# Patient Record
Sex: Female | Born: 1945 | Race: White | Hispanic: No | Marital: Single | Smoking: Never smoker
Health system: Southern US, Community
[De-identification: ages and names within clinical notes are randomized; demographics above are authoritative.]

## PROBLEM LIST (undated history)

## (undated) DIAGNOSIS — K449 Diaphragmatic hernia without obstruction or gangrene: Secondary | ICD-10-CM

## (undated) DIAGNOSIS — R1084 Generalized abdominal pain: Secondary | ICD-10-CM

## (undated) DIAGNOSIS — N200 Calculus of kidney: Secondary | ICD-10-CM

## (undated) DIAGNOSIS — J449 Chronic obstructive pulmonary disease, unspecified: Secondary | ICD-10-CM

## (undated) DIAGNOSIS — E538 Deficiency of other specified B group vitamins: Secondary | ICD-10-CM

## (undated) DIAGNOSIS — IMO0001 Reserved for inherently not codable concepts without codable children: Secondary | ICD-10-CM

## (undated) DIAGNOSIS — R209 Unspecified disturbances of skin sensation: Principal | ICD-10-CM

## (undated) DIAGNOSIS — G8929 Other chronic pain: Secondary | ICD-10-CM

## (undated) DIAGNOSIS — G43909 Migraine, unspecified, not intractable, without status migrainosus: Secondary | ICD-10-CM

## (undated) DIAGNOSIS — E46 Unspecified protein-calorie malnutrition: Secondary | ICD-10-CM

## (undated) DIAGNOSIS — E785 Hyperlipidemia, unspecified: Secondary | ICD-10-CM

## (undated) DIAGNOSIS — E041 Nontoxic single thyroid nodule: Secondary | ICD-10-CM

## (undated) DIAGNOSIS — N29 Other disorders of kidney and ureter in diseases classified elsewhere: Secondary | ICD-10-CM

## (undated) DIAGNOSIS — E559 Vitamin D deficiency, unspecified: Secondary | ICD-10-CM

## (undated) DIAGNOSIS — R161 Splenomegaly, not elsewhere classified: Secondary | ICD-10-CM

## (undated) DIAGNOSIS — R634 Abnormal weight loss: Secondary | ICD-10-CM

## (undated) DIAGNOSIS — R3129 Other microscopic hematuria: Secondary | ICD-10-CM

## (undated) DIAGNOSIS — R64 Cachexia: Secondary | ICD-10-CM

## (undated) DIAGNOSIS — K219 Gastro-esophageal reflux disease without esophagitis: Secondary | ICD-10-CM

## (undated) DIAGNOSIS — F418 Other specified anxiety disorders: Secondary | ICD-10-CM

## (undated) DIAGNOSIS — G5600 Carpal tunnel syndrome, unspecified upper limb: Secondary | ICD-10-CM

## (undated) DIAGNOSIS — R1011 Right upper quadrant pain: Secondary | ICD-10-CM

## (undated) DIAGNOSIS — R768 Other specified abnormal immunological findings in serum: Secondary | ICD-10-CM

## (undated) DIAGNOSIS — R1310 Dysphagia, unspecified: Secondary | ICD-10-CM

## (undated) DIAGNOSIS — R590 Localized enlarged lymph nodes: Secondary | ICD-10-CM

## (undated) DIAGNOSIS — K5900 Constipation, unspecified: Secondary | ICD-10-CM

## (undated) DIAGNOSIS — G609 Hereditary and idiopathic neuropathy, unspecified: Secondary | ICD-10-CM

## (undated) HISTORY — DX: Hereditary and idiopathic neuropathy, unspecified: G60.9

## (undated) HISTORY — DX: Other specified abnormal immunological findings in serum: R76.8

## (undated) HISTORY — DX: Nontoxic single thyroid nodule: E04.1

## (undated) HISTORY — DX: Cachexia: R64

## (undated) HISTORY — DX: Gastro-esophageal reflux disease without esophagitis: K21.9

## (undated) HISTORY — DX: Carpal tunnel syndrome, unspecified upper limb: G56.00

## (undated) HISTORY — DX: Diaphragmatic hernia without obstruction or gangrene: K44.9

## (undated) HISTORY — DX: Hyperlipidemia, unspecified: E78.5

## (undated) HISTORY — DX: Chronic obstructive pulmonary disease, unspecified: J44.9

## (undated) HISTORY — DX: Dysphagia, unspecified: R13.10

## (undated) HISTORY — DX: Deficiency of other specified B group vitamins: E53.8

## (undated) HISTORY — DX: Vitamin D deficiency, unspecified: E55.9

## (undated) HISTORY — DX: Abnormal weight loss: R63.4

## (undated) HISTORY — DX: Other specified anxiety disorders: F41.8

## (undated) HISTORY — DX: Other disorders of kidney and ureter in diseases classified elsewhere: N29

## (undated) HISTORY — DX: Unspecified disturbances of skin sensation: R20.9

## (undated) HISTORY — DX: Other disorders of calcium metabolism: E83.59

## (undated) HISTORY — DX: Other microscopic hematuria: R31.29

## (undated) HISTORY — DX: Migraine, unspecified, not intractable, without status migrainosus: G43.909

## (undated) HISTORY — DX: Calculus of kidney: N20.0

## (undated) HISTORY — DX: Localized enlarged lymph nodes: R59.0

## (undated) HISTORY — DX: Other chronic pain: G89.29

## (undated) HISTORY — DX: Constipation, unspecified: K59.00

## (undated) HISTORY — DX: Reserved for inherently not codable concepts without codable children: IMO0001

## (undated) HISTORY — DX: Right upper quadrant pain: R10.11

## (undated) HISTORY — PX: OTHER SURGICAL HISTORY: SHX169

## (undated) HISTORY — DX: Unspecified protein-calorie malnutrition: E46

## (undated) HISTORY — DX: Generalized abdominal pain: R10.84

## (undated) HISTORY — DX: Splenomegaly, not elsewhere classified: R16.1

---

## 1976-08-29 HISTORY — PX: CHOLECYSTECTOMY: SHX55

## 1982-08-29 HISTORY — PX: NASAL SEPTUM SURGERY: SHX37

## 1986-08-29 HISTORY — PX: TUBAL LIGATION: SHX77

## 1996-08-29 HISTORY — PX: DILATION AND CURETTAGE OF UTERUS: SHX78

## 1996-08-29 HISTORY — PX: ABDOMINAL HYSTERECTOMY: SHX81

## 2003-11-14 ENCOUNTER — Emergency Department (HOSPITAL_COMMUNITY): Admission: EM | Admit: 2003-11-14 | Discharge: 2003-11-14 | Payer: Self-pay | Admitting: Emergency Medicine

## 2009-05-06 ENCOUNTER — Inpatient Hospital Stay (HOSPITAL_COMMUNITY): Admission: EM | Admit: 2009-05-06 | Discharge: 2009-05-08 | Payer: Self-pay | Admitting: Emergency Medicine

## 2010-12-03 LAB — CBC
HCT: 32.3 % — ABNORMAL LOW (ref 36.0–46.0)
Hemoglobin: 10.9 g/dL — ABNORMAL LOW (ref 12.0–15.0)
MCHC: 33.9 g/dL (ref 30.0–36.0)
MCV: 92.9 fL (ref 78.0–100.0)
MCV: 93.5 fL (ref 78.0–100.0)
Platelets: 293 10*3/uL (ref 150–400)
RBC: 3.45 MIL/uL — ABNORMAL LOW (ref 3.87–5.11)
RDW: 13.9 % (ref 11.5–15.5)
RDW: 14 % (ref 11.5–15.5)
WBC: 14.1 10*3/uL — ABNORMAL HIGH (ref 4.0–10.5)

## 2010-12-03 LAB — COMPREHENSIVE METABOLIC PANEL
ALT: 15 U/L (ref 0–35)
BUN: 7 mg/dL (ref 6–23)
CO2: 26 mEq/L (ref 19–32)
Calcium: 8.5 mg/dL (ref 8.4–10.5)
Creatinine, Ser: 0.41 mg/dL (ref 0.4–1.2)
GFR calc non Af Amer: >60 mL/min (ref >60)
Glucose, Bld: 86 mg/dL (ref 70–99)
Sodium: 140 mEq/L (ref 135–145)
Total Protein: 5.9 g/dL — ABNORMAL LOW (ref 6.0–8.3)

## 2010-12-03 LAB — CULTURE, BLOOD (ROUTINE X 2)
Culture: NO GROWTH
Culture: NO GROWTH

## 2010-12-03 LAB — BASIC METABOLIC PANEL
BUN: 7 mg/dL (ref 6–23)
Creatinine, Ser: 0.47 mg/dL (ref 0.4–1.2)
GFR calc non Af Amer: >60 mL/min (ref >60)
Glucose, Bld: 114 mg/dL — ABNORMAL HIGH (ref 70–99)

## 2010-12-03 LAB — POCT CARDIAC MARKERS: Troponin i, poc: <0.05 ng/mL (ref 0.00–0.09)

## 2010-12-03 LAB — DIFFERENTIAL
Basophils Absolute: 0.2 10*3/uL — ABNORMAL HIGH (ref 0.0–0.1)
Eosinophils Absolute: 0 10*3/uL (ref 0.0–0.7)
Eosinophils Absolute: 0 10*3/uL (ref 0.0–0.7)
Eosinophils Relative: 0 % (ref 0–5)
Lymphs Abs: 1.5 10*3/uL (ref 0.7–4.0)
Lymphs Abs: 2 10*3/uL (ref 0.7–4.0)
Monocytes Relative: 6 % (ref 3–12)
Neutro Abs: 6 10*3/uL (ref 1.7–7.7)
Neutrophils Relative %: 70 % (ref 43–77)
Neutrophils Relative %: 84 % — ABNORMAL HIGH (ref 43–77)

## 2010-12-03 LAB — PROTIME-INR: Prothrombin Time: 12.2 seconds (ref 11.6–15.2)

## 2010-12-03 LAB — D-DIMER, QUANTITATIVE: D-Dimer, Quant: 0.34 ug/mL-FEU (ref 0.00–0.48)

## 2010-12-03 LAB — LACTIC ACID, PLASMA: Lactic Acid, Venous: 1.9 mmol/L (ref 0.5–2.2)

## 2010-12-03 LAB — MAGNESIUM: Magnesium: 2 mg/dL (ref 1.5–2.5)

## 2013-08-12 ENCOUNTER — Encounter: Payer: Self-pay | Admitting: Neurology

## 2013-08-13 ENCOUNTER — Ambulatory Visit (INDEPENDENT_AMBULATORY_CARE_PROVIDER_SITE_OTHER): Payer: Medicare Other | Admitting: Neurology

## 2013-08-13 ENCOUNTER — Encounter: Payer: Self-pay | Admitting: Neurology

## 2013-08-13 ENCOUNTER — Encounter (INDEPENDENT_AMBULATORY_CARE_PROVIDER_SITE_OTHER): Payer: Self-pay

## 2013-08-13 VITALS — BP 131/79 | HR 104 | Ht 66.0 in | Wt 98.0 lb

## 2013-08-13 DIAGNOSIS — IMO0001 Reserved for inherently not codable concepts without codable children: Secondary | ICD-10-CM

## 2013-08-13 DIAGNOSIS — R209 Unspecified disturbances of skin sensation: Secondary | ICD-10-CM

## 2013-08-13 HISTORY — DX: Unspecified disturbances of skin sensation: R20.9

## 2013-08-13 HISTORY — DX: Reserved for inherently not codable concepts without codable children: IMO0001

## 2013-08-13 NOTE — Patient Instructions (Signed)

## 2013-08-13 NOTE — Progress Notes (Signed)
Reason for visit: Numbness  Catherine Gregory is a 67 y.o. female  History of present illness:  Catherine Gregory is a 67 year old right-handed white female with a history of fibromyalgia that dates back 10/17/1996. The patient indicates that she has some discomfort in the legs, arms, and body. The patient has pain in the knees, shoulders, thighs and hips. The pain does vary some day-to-day, with some migratory features. The patient has also had some patchy numbness that goes back to 1998 as well, again on the arms and legs with some migratory features. Within the last 6 weeks, the patient has had some migration of the numbness on the lower face up to the eye level bilaterally. The patient indicates that she has had some facial numbness in the lower face for number of years. The patient indicates that her fibromyalgia-type symptoms began after a hysterectomy in 1998. The patient does have some gait instability, but she denies problems controlling the bowels or the bladder. The patient has soreness of the feet, and she describes sharp lancinating pains that may occur throughout the body. The patient may have some achy sensations as well. The patient may have occasional headaches that she attributes to a sinus type headache. The patient has some generalized fatigue, no focal weakness. The patient denies any vision changes. In the past, the patient indicates that she has been told that she had an abnormal MRI the brain, and it was felt that she could have multiple sclerosis. Treatment was recommended, but the patient never initiated treatment for MS. The patient refused to have a lumbar puncture. The patient has recently moved from [place] to the [place], [place] area. The patient currently has no primary care physician. The patient comes to this office for an evaluation. Recent blood work has been done that includes a vitamin B12 level, thyroid profile, chemistry profile that were unremarkable, the  results were reviewed. The patient indicates that she has a history of a peripheral neuropathy, but it is not clear what studies have been done to confirm this. The patient also reports that she coughs with eating and swallowing, and prior swallow studies have been unremarkable.  Past Medical History  Diagnosis Date  . Disturbance of skin sensation 08/13/2013  . Myalgia and myositis, unspecified 08/13/2013  . Depression with anxiety   . GERD (gastroesophageal reflux disease)   . Hiatal hernia   . Malnutrition   . Migraine   . Renal calculus     Past Surgical History  Procedure Laterality Date  . Cholecystectomy    . Abdominal hysterectomy    . Tubal ligation Bilateral   . Dilation and curettage of uterus    . Nasal septum surgery      Family History  Problem Relation Age of Onset  . Heart Problems Father   . Multiple sclerosis Sister   . Heart Problems Brother   . Hypertension Brother   . Heart Problems Brother   . Hypertension Brother   . Hypertension Brother   . Hypertension Brother   . Hypertension Brother     Social history:  reports that she has never smoked. She has never used smokeless tobacco. She reports that she does not drink alcohol or use illicit drugs.  Medications:  Current Outpatient Prescriptions on File Prior to Visit  Medication Sig Dispense Refill  . estradiol (CLIMARA - DOSED IN MG/24 HR) 0.025 mg/24hr patch Place 0.025 mg onto the skin once a week. BI WEEKLY  No current facility-administered medications on file prior to visit.      Allergies  Allergen Reactions  . Prednisone   . Albuterol   . Benadryl [Diphenhydramine Hcl]   . Doxycycline   . Ducodyl [Bisacodyl]   . Erythromycin   . Levaquin [Levofloxacin In D5w]     ROS:  Out of a complete 14 system review of symptoms, the patient complains only of the following symptoms, and all other reviewed systems are negative.  Fatigue Difficulty swallowing Itching sensations Eye  pain Cough, wheezing Easy bruising Feeling hot, cold Joint pain, achy muscles Allergies, runny nose, skin sensitivity Headache, numbness, weakness, difficulty swallowing  Blood pressure 131/79, pulse 104, height 5\' 6"  (1.676 m), weight 98 lb (44.453 kg).  Physical Exam  General: The patient is alert and cooperative at the time of the examination.  Head: Pupils are equal, round, and reactive to light. Discs are flat bilaterally.  Neck: The neck is supple, no carotid bruits are noted.  Respiratory: The respiratory examination is clear.  Cardiovascular: The cardiovascular examination reveals a regular rate and rhythm, no obvious murmurs or rubs are noted.  Skin: Extremities are without significant edema.  Neurologic Exam  Mental status: The patient is alert and oriented x 3 at the time of the examination.  Cranial nerves: Facial symmetry is present. There is good sensation of the face to pinprick and soft touch bilaterally. The strength of the facial muscles and the muscles to head turning and shoulder shrug are normal bilaterally. Speech is well enunciated, no aphasia or dysarthria is noted. Extraocular movements are full. Visual fields are full.  Motor: The motor testing reveals 5 over 5 strength of all 4 extremities. Good symmetric motor tone is noted throughout.  Sensory: Sensory testing is intact to pinprick, soft touch, vibration sensation, and position sense on all 4 extremities, with exception that pinprick sensation is decreased on the left leg. No evidence of extinction is noted.  Coordination: Cerebellar testing reveals good finger-nose-finger and heel-to-shin bilaterally.  Gait and station: Gait is normal. Tandem gait is slightly unsteady. Romberg is negative. No drift is seen.  Reflexes: Deep tendon reflexes are symmetric, but are depressed bilaterally. Toes are downgoing bilaterally.   Assessment/Plan:  1. Fibromyalgia  2. Migratory sensory alteration  The  patient has recently moved to this area. The patient has long-standing history of migratory pain, sensory changes, and some history of an abnormal MRI of the brain. The patient will be set up for a MRI brain study, and some blood work today. If the MRI of the brain actually is abnormal, we will consider MRI of the cervical spine to evaluate for demyelinating disease. Migratory sensory phenomena maybe associated with the fibromyalgia syndrome. The patient will followup in 4 months. The patient does not wish to have any medications for her fibromyalgia.  Marlan Palau MD 08/13/2013 7:19 PM  [facility] [address] [place], [place] [address]  Phone [phone] Fax [phone]

## 2013-08-16 ENCOUNTER — Telehealth: Payer: Self-pay | Admitting: *Deleted

## 2013-08-16 LAB — COPPER, SERUM: Copper: 116 ug/dL (ref 72–166)

## 2013-08-16 LAB — LYME, TOTAL AB TEST/REFLEX: Lyme IgG/IgM Ab: <0.91 {ISR} (ref 0.00–0.90)

## 2013-08-16 LAB — ANGIOTENSIN CONVERTING ENZYME: Angio Convert Enzyme: 51 U/L (ref 14–82)

## 2013-08-16 NOTE — Telephone Encounter (Signed)
Called patient to relay his lab results where unremarkable.

## 2013-08-16 NOTE — Telephone Encounter (Signed)
Message copied by Salome Spotted on Fri Aug 16, 2013  1:27 PM ------      Message from: Stephanie Acre      Created: Fri Aug 16, 2013  7:52 AM       Please call the patient. The blood work results are unremarkable. Thank you.            ----- Message -----         From: [facility] Lab Results In Interface         Sent: 08/16/2013   5:47 AM           To: York Spaniel, MD                   ------

## 2013-08-20 ENCOUNTER — Telehealth: Payer: Self-pay | Admitting: Neurology

## 2013-08-20 NOTE — Telephone Encounter (Signed)
Shared unremarkable labs with patient per Dr Clarisa Kindred findings,patient verbalized understanding

## 2013-08-20 NOTE — Telephone Encounter (Signed)
Patient returning call about lab results. Please call the patient back.

## 2013-08-23 ENCOUNTER — Ambulatory Visit
Admission: RE | Admit: 2013-08-23 | Discharge: 2013-08-23 | Disposition: A | Discharge status: Home / Self Care | Payer: Medicaid Other | Source: Ambulatory Visit | Attending: Neurology | Admitting: Neurology

## 2013-08-23 DIAGNOSIS — IMO0001 Reserved for inherently not codable concepts without codable children: Secondary | ICD-10-CM

## 2013-08-23 DIAGNOSIS — R209 Unspecified disturbances of skin sensation: Secondary | ICD-10-CM

## 2013-09-02 ENCOUNTER — Telehealth: Payer: Self-pay | Admitting: *Deleted

## 2013-09-06 NOTE — Telephone Encounter (Signed)
Gave to [place], she stated that no new order needed.  Will check on the new insurance auth.

## 2013-09-12 ENCOUNTER — Ambulatory Visit
Admission: RE | Admit: 2013-09-12 | Discharge: 2013-09-12 | Disposition: A | Discharge status: Home / Self Care | Payer: Medicare HMO | Source: Ambulatory Visit | Attending: Neurology | Admitting: Neurology

## 2013-09-12 DIAGNOSIS — R209 Unspecified disturbances of skin sensation: Secondary | ICD-10-CM

## 2013-09-16 ENCOUNTER — Telehealth: Payer: Self-pay | Admitting: Neurology

## 2013-09-16 DIAGNOSIS — R9089 Other abnormal findings on diagnostic imaging of central nervous system: Secondary | ICD-10-CM

## 2013-09-16 DIAGNOSIS — R209 Unspecified disturbances of skin sensation: Secondary | ICD-10-CM

## 2013-09-16 NOTE — Telephone Encounter (Signed)
I called patient. MRI the brain showed nonspecific white matter changes. The patient in the past has been told she may have multiple sclerosis, but she also has a history of migraine which can produce similar white matter changes. I have recommended MRI of the cervical spine. If this is unremarkable, we will pursue lumbar puncture.  IMPRESSION: Abnormal MRI brain showing nonspecific supratentorial white matter hyperintensities with differential discussed above.

## 2013-09-24 ENCOUNTER — Inpatient Hospital Stay: Admission: RE | Admit: 2013-09-24 | Payer: Medicare HMO | Source: Ambulatory Visit

## 2013-09-25 ENCOUNTER — Encounter: Payer: Self-pay | Admitting: Physician Assistant

## 2013-09-25 ENCOUNTER — Ambulatory Visit (INDEPENDENT_AMBULATORY_CARE_PROVIDER_SITE_OTHER): Payer: Medicare HMO | Admitting: Physician Assistant

## 2013-09-25 VITALS — BP 136/80 | HR 106 | Temp 97.8°F | Ht 65.25 in | Wt 94.5 lb

## 2013-09-25 DIAGNOSIS — R634 Abnormal weight loss: Secondary | ICD-10-CM | POA: Insufficient documentation

## 2013-09-25 DIAGNOSIS — R1310 Dysphagia, unspecified: Secondary | ICD-10-CM | POA: Insufficient documentation

## 2013-09-25 DIAGNOSIS — Z780 Asymptomatic menopausal state: Secondary | ICD-10-CM

## 2013-09-25 DIAGNOSIS — Z0000 Encounter for general adult medical examination without abnormal findings: Secondary | ICD-10-CM

## 2013-09-25 DIAGNOSIS — K449 Diaphragmatic hernia without obstruction or gangrene: Secondary | ICD-10-CM

## 2013-09-25 DIAGNOSIS — R209 Unspecified disturbances of skin sensation: Secondary | ICD-10-CM

## 2013-09-25 DIAGNOSIS — G609 Hereditary and idiopathic neuropathy, unspecified: Secondary | ICD-10-CM | POA: Insufficient documentation

## 2013-09-25 DIAGNOSIS — E041 Nontoxic single thyroid nodule: Secondary | ICD-10-CM | POA: Insufficient documentation

## 2013-09-25 DIAGNOSIS — R109 Unspecified abdominal pain: Secondary | ICD-10-CM | POA: Insufficient documentation

## 2013-09-25 DIAGNOSIS — J438 Other emphysema: Secondary | ICD-10-CM | POA: Insufficient documentation

## 2013-09-25 DIAGNOSIS — N29 Other disorders of kidney and ureter in diseases classified elsewhere: Secondary | ICD-10-CM

## 2013-09-25 DIAGNOSIS — E559 Vitamin D deficiency, unspecified: Secondary | ICD-10-CM | POA: Insufficient documentation

## 2013-09-25 DIAGNOSIS — H579 Unspecified disorder of eye and adnexa: Secondary | ICD-10-CM

## 2013-09-25 DIAGNOSIS — E785 Hyperlipidemia, unspecified: Secondary | ICD-10-CM | POA: Insufficient documentation

## 2013-09-25 DIAGNOSIS — K219 Gastro-esophageal reflux disease without esophagitis: Secondary | ICD-10-CM | POA: Insufficient documentation

## 2013-09-25 DIAGNOSIS — K5900 Constipation, unspecified: Secondary | ICD-10-CM | POA: Insufficient documentation

## 2013-09-25 NOTE — Progress Notes (Signed)
[name] is a 68 y.o. female DOB: [id number] MRN: [id number]     HPI: patient is a 68 year old female who presents to the clinic to establish care. Patient has recently relocated to local area. Has been seen by neurology, Dr. Anne HahnWillis, for numbness and tingling of U/LE bilateral, recently starting in neck and lower face. Patient states concern for MS, reporting a sister with MS and similar symptoms. Patient reports long history of Fibromyalgia, hiatal hernia, past colon polyps, rectocele, thyroiditis with thyroid nodule and increased ocular pressure. Reports medications to include a Vitamin D and Estradiol. States she has tried multiple medications in past for different diagnosis however, states always feels worse when taking the medications so discontinues use. Denies chest pain/palpitations, chest tightening, SOB, cough, fever/chills, change in activity, change in bowel/bladder habits, visual change or disturbance.   Influenza vaccine: 11/14 Colonoscopy: 2012/normal Mammo: spring 2014 Pneumovax: yes, date uncertain  Zoster: 2011  Request referrals to GYN, Neuro, GI and Opthalmology    ROS: As stated in HPI. All other systems negative   Past Medical History  Diagnosis Date  . Disturbance of skin sensation 08/13/2013  . Myalgia and myositis, unspecified 08/13/2013  . Depression with anxiety   . GERD (gastroesophageal reflux disease)   . Hiatal hernia   . Malnutrition   . Migraine   . Renal calculus   . Dysphagia, idiopathic   . Abnormal weight loss   . GERD (gastroesophageal reflux disease)   . COPD (chronic obstructive pulmonary disease)   . Elevated antinuclear antibody (ANA) level   . Vitamin D deficiency   . Idiopathic peripheral neuropathy   . Cachexia   . Abdominal pain, chronic, generalized   . Constipation   . Vitamin B12 deficiency   . Right thyroid nodule   . Lymphadenopathy, inguinal   . Spleen enlarged   . Abdominal pain, RUQ   .  Nephrocalcinosis   . Microscopic hematuria   . Hyperlipidemia    Family History  Problem Relation Age of Onset  . Heart Problems Father   . Hyperlipidemia Father   . Heart disease Father   . Hypertension Father   . Multiple sclerosis Sister   . Heart Problems Brother   . Hypertension Brother   . Heart Problems Brother   . Hypertension Brother   . Hypertension Brother   . Hypertension Brother   . Hypertension Brother   . Cancer Maternal Aunt     Colon   History   Social History  . Marital Status: Married    Spouse Name: N/A    Number of Children: 2  . Years of Education: COLLEGE-2   Occupational History  . RETIRED    Social History Main Topics  . Smoking status: Never Smoker   . Smokeless tobacco: Never Used  . Alcohol Use: No     Comment: FORMER WINE OCCASIONALLY/ QUIT 1997  . Drug Use: No  . Sexual Activity: None   Other Topics Concern  . None   Social History Narrative  . None   Past Surgical History  Procedure Laterality Date  . Cholecystectomy  1978  . Abdominal hysterectomy  1998  . Tubal ligation Bilateral 1988  . Dilation and curettage of uterus  1998  . Nasal septum surgery  1984    PE: CONSTITUTIONAL: Well developed, well nourished, pleasant, appears stated age, in NAD HEENT: normocephalic, atraumatic, bilateral ext/int canals normal. Bilateral TM's without injections, bulging, erythema. Nose normal, uvula midline, oropharynx  clear and moist. EYES: PERRLA, bilateral EOM and conjunctiva normal NECK: FROM, supple, without thyromegaly or mass CARDIO: RRR, normal S1 and S2, distal pulses intact. PULM/CHEST CTA bilateral, no wheezes, rales or rhonchi. Non tender. ABD: appearance normal, soft, nontender. Normal bowel sounds x 4 quadrants GU: deferred to GYN MUSC: FROM U/LE bilateral LYMPH: no cervical, supraclavicular adenopathy NEURO: alert and oriented x 3, no cranial nerve deficit, motor strength and coordination NL. Patellar DTR's 2+, Negative  romberg. Gait normal. SKIN: warm, dry, no rash or lesions noted. PSYCH: Mood and affect normal, speech normal.   Lab Results  Component Value Date   WBC 8.5 05/06/2009   HGB 10.9 DELTA CHECK NOTED REPEATED TO VERIFY* 05/06/2009   HCT 32.3* 05/06/2009   PLT 219 DELTA CHECK NOTED REPEATED TO VERIFY 05/06/2009   GLUCOSE 86 05/06/2009   ALT 15 05/06/2009   AST 19 05/06/2009   NA 140 05/06/2009   K 3.9 05/06/2009   CL 109 05/06/2009   CREATININE 0.41 05/06/2009   BUN 7 05/06/2009   CO2 26 05/06/2009   INR 0.9 05/05/2009    ASSESSMENT and PLAN   CPX/v70.0 - Patient has been counseled on age-appropriate routine health concerns for screening and prevention. These are reviewed and up-to-date. Immunizations are up-to-date or declined. Labs ordered and will be reviewed.  Referrals to GYN, Neuro, GI and Opthalmology placed.   Patient instructed to follow up in 2-3 months or sooner if needed.

## 2013-09-25 NOTE — Assessment & Plan Note (Signed)
Neurology A&P from 12/14  The patient has recently moved to this area. The patient has long-standing history of migratory pain, sensory changes, and some history of an abnormal MRI of the brain. The patient will be set up for a MRI brain study, and some blood work today. If the MRI of the brain actually is abnormal, we will consider MRI of the cervical spine to evaluate for demyelinating disease. Migratory sensory phenomena maybe associated with the fibromyalgia syndrome. The patient will followup in 4 months. The patient does not wish to have any medications for her fibromyalgia.

## 2013-09-25 NOTE — Patient Instructions (Signed)
It was great meeting you today Catherine Gregory!  Labs have been ordered for you, when you report to lab please be fasting.   I have placed your referrals as discussed.   Please return to office in next month or two for complete physical exam.   Health Recommendations for Postmenopausal Women Respected and ongoing research has looked at the most common causes of death, disability, and poor quality of life in postmenopausal women. The causes include heart disease, diseases of blood vessels, diabetes, depression, cancer, and bone loss (osteoporosis). Many things can be done to help lower the chances of developing these and other common problems: CARDIOVASCULAR DISEASE Heart Disease: A heart attack is a medical emergency. Know the signs and symptoms of a heart attack. Below are things women can do to reduce their risk for heart disease.   Do not smoke. If you smoke, quit.  Aim for a healthy weight. Being overweight causes many preventable deaths. Eat a healthy and balanced diet and drink an adequate amount of liquids.  Get moving. Make a commitment to be more physically active. Aim for 30 minutes of activity on most, if not all days of the week.  Eat for heart health. Choose a diet that is low in saturated fat and cholesterol and eliminate trans fat. Include whole grains, vegetables, and fruits. Read and understand the labels on food containers before buying.  Know your numbers. Ask your caregiver to check your blood pressure, cholesterol (total, HDL, LDL, triglycerides) and blood glucose. Work with your caregiver on improving your entire clinical picture.  High blood pressure. Limit or stop your table salt intake (try salt substitute and food seasonings). Avoid salty foods and drinks. Read labels on food containers before buying. Eating well and exercising can help control high blood pressure. STROKE  Stroke is a medical emergency. Stroke may be the result of a blood clot in a blood vessel in the  brain or by a brain hemorrhage (bleeding). Know the signs and symptoms of a stroke. To lower the risk of developing a stroke:  Avoid fatty foods.  Quit smoking.  Control your diabetes, blood pressure, and irregular heart rate. THROMBOPHLEBITIS (BLOOD CLOT) OF THE LEG  Becoming overweight and leading a stationary lifestyle may also contribute to developing blood clots. Controlling your diet and exercising will help lower the risk of developing blood clots. CANCER SCREENING  Breast Cancer: Take steps to reduce your risk of breast cancer.  You should practice "breast self-awareness." This means understanding the normal appearance and feel of your breasts and should include breast self-examination. Any changes detected, no matter how small, should be reported to your caregiver.  After age 40, you should have a clinical breast exam (CBE) every year.  Starting at age 80, you should consider having a mammogram (breast X-ray) every year.  If you have a family history of breast cancer, talk to your caregiver about genetic screening.  If you are at high risk for breast cancer, talk to your caregiver about having an MRI and a mammogram every year.  Intestinal or Stomach Cancer: Tests to consider are a rectal exam, fecal occult blood, sigmoidoscopy, and colonoscopy. Women who are high risk may need to be screened at an earlier age and more often.  Cervical Cancer:  Beginning at age 24, you should have a Pap test every 3 years as long as the past 3 Pap tests have been normal.  If you have had past treatment for cervical cancer or a condition that  could lead to cancer, you need Pap tests and screening for cancer for at least 20 years after your treatment.  If you had a hysterectomy for a problem that was not cancer or a condition that could lead to cancer, then you no longer need Pap tests.  If you are between ages 6465 and 1570, and you have had normal Pap tests going back 10 years, you no longer  need Pap tests.  If Pap tests have been discontinued, risk factors (such as a new sexual partner) need to be reassessed to determine if screening should be resumed.  Some medical problems can increase the chance of getting cervical cancer. In these cases, your caregiver may recommend more frequent screening and Pap tests.  Uterine Cancer: If you have vaginal bleeding after reaching menopause, you should notify your caregiver.  Ovarian cancer: Other than yearly pelvic exams, there are no reliable tests available to screen for ovarian cancer at this time except for yearly pelvic exams.  Lung Cancer: Yearly chest X-rays can detect lung cancer and should be done on high risk women, such as cigarette smokers and women with chronic lung disease (emphysema).  Skin Cancer: A complete body skin exam should be done at your yearly examination. Avoid overexposure to the sun and ultraviolet light lamps. Use a strong sun block cream when in the sun. All of these things are important in lowering the risk of skin cancer. MENOPAUSE Menopause Symptoms: Hormone therapy products are effective for treating symptoms associated with menopause:  Moderate to severe hot flashes.  Night sweats.  Mood swings.  Headaches.  Tiredness.  Loss of sex drive.  Insomnia.  Other symptoms. Hormone replacement carries certain risks, especially in older women. Women who use or are thinking about using estrogen or estrogen with progestin treatments should discuss that with their caregiver. Your caregiver will help you understand the benefits and risks. The ideal dose of hormone replacement therapy is not known. The Food and Drug Administration (FDA) has concluded that hormone therapy should be used only at the lowest doses and for the shortest amount of time to reach treatment goals.  OSTEOPOROSIS Protecting Against Bone Loss and Preventing Fracture: If you use hormone therapy for prevention of bone loss (osteoporosis),  the risks for bone loss must outweigh the risk of the therapy. Ask your caregiver about other medications known to be safe and effective for preventing bone loss and fractures. To guard against bone loss or fractures, the following is recommended:  If you are less than age 69, take 1000 mg of calcium and at least 600 mg of Vitamin D per day.  If you are greater than age 69 but less than age 69, take 1200 mg of calcium and at least 600 mg of Vitamin D per day.  If you are greater than age 69, take 1200 mg of calcium and at least 800 mg of Vitamin D per day. Smoking and excessive alcohol intake increases the risk of osteoporosis. Eat foods rich in calcium and vitamin D and do weight bearing exercises several times a week as your caregiver suggests. DIABETES Diabetes Melitus: If you have Type I or Type 2 diabetes, you should keep your blood sugar under control with diet, exercise and recommended medication. Avoid too many sweets, starchy and fatty foods. Being overweight can make control more difficult. COGNITION AND MEMORY Cognition and Memory: Menopausal hormone therapy is not recommended for the prevention of cognitive disorders such as Alzheimer's disease or memory loss.  DEPRESSION  Depression may occur at any age, but is common in elderly women. The reasons may be because of physical, medical, social (loneliness), or financial problems and needs. If you are experiencing depression because of medical problems and control of symptoms, talk to your caregiver about this. Physical activity and exercise may help with mood and sleep. Community and volunteer involvement may help your sense of value and worth. If you have depression and you feel that the problem is getting worse or becoming severe, talk to your caregiver about treatment options that are best for you. ACCIDENTS  Accidents are common and can be serious in the elderly woman. Prepare your house to prevent accidents. Eliminate throw rugs, place  hand bars in the bath, shower and toilet areas. Avoid wearing high heeled shoes or walking on wet, snowy, and icy areas. Limit or stop driving if you have vision or hearing problems, or you feel you are unsteady with you movements and reflexes. HEPATITIS C Hepatitis C is a type of viral infection affecting the liver. It is spread mainly through contact with blood from an infected person. It can be treated, but if left untreated, it can lead to severe liver damage over years. Many people who are infected do not know that the virus is in their blood. If you are a "baby-boomer", it is recommended that you have one screening test for Hepatitis C. IMMUNIZATIONS  Several immunizations are important to consider having during your senior years, including:   Tetanus, diptheria, and pertussis booster shot.  Influenza every year before the flu season begins.  Pneumonia vaccine.  Shingles vaccine.  Others as indicated based on your specific needs. Talk to your caregiver about these. Document Released: 10/07/2005 Document Revised: 08/01/2012 Document Reviewed: 06/02/2008 [facility] Patient Information 2014 [place], [place]. Hypertriglyceridemia  Diet for High blood levels of Triglycerides Most fats in food are triglycerides. Triglycerides in your blood are stored as fat in your body. High levels of triglycerides in your blood may put you at a greater risk for heart disease and stroke.  Normal triglyceride levels are less than 150 mg/dL. Borderline high levels are 150-199 mg/dl. High levels are 200 - 499 mg/dL, and very high triglyceride levels are greater than 500 mg/dL. The decision to treat high triglycerides is generally based on the level. For people with borderline or high triglyceride levels, treatment includes weight loss and exercise. Drugs are recommended for people with very high triglyceride levels. Many people who need treatment for high triglyceride levels have metabolic syndrome. This syndrome is  a collection of disorders that often include: insulin resistance, high blood pressure, blood clotting problems, high cholesterol and triglycerides. TESTING PROCEDURE FOR TRIGLYCERIDES  You should not eat 4 hours before getting your triglycerides measured. The normal range of triglycerides is between 10 and 250 milligrams per deciliter (mg/dl). Some people may have extreme levels (1000 or above), but your triglyceride level may be too high if it is above 150 mg/dl, depending on what other risk factors you have for heart disease.  People with high blood triglycerides may also have high blood cholesterol levels. If you have high blood cholesterol as well as high blood triglycerides, your risk for heart disease is probably greater than if you only had high triglycerides. High blood cholesterol is one of the main risk factors for heart disease. CHANGING YOUR DIET  Your weight can affect your blood triglyceride level. If you are more than 20% above your ideal body weight, you may be able to lower your  blood triglycerides by losing weight. Eating less and exercising regularly is the best way to combat this. Fat provides more calories than any other food. The best way to lose weight is to eat less fat. Only 30% of your total calories should come from fat. Less than 7% of your diet should come from saturated fat. A diet low in fat and saturated fat is the same as a diet to decrease blood cholesterol. By eating a diet lower in fat, you may lose weight, lower your blood cholesterol, and lower your blood triglyceride level.  Eating a diet low in fat, especially saturated fat, may also help you lower your blood triglyceride level. Ask your dietitian to help you figure how much fat you can eat based on the number of calories your caregiver has prescribed for you.  Exercise, in addition to helping with weight loss may also help lower triglyceride levels.   Alcohol can increase blood triglycerides. You may need to stop  drinking alcoholic beverages.  Too much carbohydrate in your diet may also increase your blood triglycerides. Some complex carbohydrates are necessary in your diet. These may include bread, rice, potatoes, other starchy vegetables and cereals.  Reduce "simple" carbohydrates. These may include pure sugars, candy, honey, and jelly without losing other nutrients. If you have the kind of high blood triglycerides that is affected by the amount of carbohydrates in your diet, you will need to eat less sugar and less high-sugar foods. Your caregiver can help you with this.  Adding 2-4 grams of fish oil (EPA+ DHA) may also help lower triglycerides. Speak with your caregiver before adding any supplements to your regimen. Following the Diet  Maintain your ideal weight. Your caregivers can help you with a diet. Generally, eating less food and getting more exercise will help you lose weight. Joining a weight control group may also help. Ask your caregivers for a good weight control group in your area.  Eat low-fat foods instead of high-fat foods. This can help you lose weight too.  These foods are lower in fat. Eat MORE of these:   Dried beans, peas, and lentils.  Egg whites.  Low-fat cottage cheese.  Fish.  Lean cuts of meat, such as round, sirloin, rump, and flank (cut extra fat off meat you fix).  Whole grain breads, cereals and pasta.  Skim and nonfat dry milk.  Low-fat yogurt.  Poultry without the skin.  Cheese made with skim or part-skim milk, such as mozzarella, parmesan, farmers', ricotta, or pot cheese. These are higher fat foods. Eat LESS of these:   Whole milk and foods made from whole milk, such as American, blue, cheddar, monterey jack, and swiss cheese  High-fat meats, such as luncheon meats, sausages, knockwurst, bratwurst, hot dogs, ribs, corned beef, ground pork, and regular ground beef.  Fried foods. Limit saturated fats in your diet. Substituting unsaturated fat for  saturated fat may decrease your blood triglyceride level. You will need to read package labels to know which products contain saturated fats.  These foods are high in saturated fat. Eat LESS of these:   Fried pork skins.  Whole milk.  Skin and fat from poultry.  Palm oil.  Butter.  Shortening.  Cream cheese.  Tomasa Blase.  Margarines and baked goods made from listed oils.  Vegetable shortenings.  Chitterlings.  Fat from meats.  Coconut oil.  Palm kernel oil.  Lard.  Cream.  Sour cream.  Fatback.  Coffee whiteners and non-dairy creamers made with these oils.  Cheese made from whole milk. Use unsaturated fats (both polyunsaturated and monounsaturated) moderately. Remember, even though unsaturated fats are better than saturated fats; you still want a diet low in total fat.  These foods are high in unsaturated fat:   Canola oil.  Sunflower oil.  Mayonnaise.  Almonds.  Peanuts.  Pine nuts.  Margarines made with these oils.  Safflower oil.  Olive oil.  Avocados.  Cashews.  Peanut butter.  Sunflower seeds.  Soybean oil.  Peanut oil.  Olives.  Pecans.  Walnuts.  Pumpkin seeds. Avoid sugar and other high-sugar foods. This will decrease carbohydrates without decreasing other nutrients. Sugar in your food goes rapidly to your blood. When there is excess sugar in your blood, your liver may use it to make more triglycerides. Sugar also contains calories without other important nutrients.  Eat LESS of these:   Sugar, brown sugar, powdered sugar, jam, jelly, preserves, honey, syrup, molasses, pies, candy, cakes, cookies, frosting, pastries, colas, soft drinks, punches, fruit drinks, and regular gelatin.  Avoid alcohol. Alcohol, even more than sugar, may increase blood triglycerides. In addition, alcohol is high in calories and low in nutrients. Ask for sparkling water, or a diet soft drink instead of an alcoholic beverage. Suggestions for planning  and preparing meals   Bake, broil, grill or roast meats instead of frying.  Remove fat from meats and skin from poultry before cooking.  Add spices, herbs, lemon juice or vinegar to vegetables instead of salt, rich sauces or gravies.  Use a non-stick skillet without fat or use no-stick sprays.  Cool and refrigerate stews and broth. Then remove the hardened fat floating on the surface before serving.  Refrigerate meat drippings and skim off fat to make low-fat gravies.  Serve more fish.  Use less butter, margarine and other high-fat spreads on bread or vegetables.  Use skim or reconstituted non-fat dry milk for cooking.  Cook with low-fat cheeses.  Substitute low-fat yogurt or cottage cheese for all or part of the sour cream in recipes for sauces, dips or congealed salads.  Use half yogurt/half mayonnaise in salad recipes.  Substitute evaporated skim milk for cream. Evaporated skim milk or reconstituted non-fat dry milk can be whipped and substituted for whipped cream in certain recipes.  Choose fresh fruits for dessert instead of high-fat foods such as pies or cakes. Fruits are naturally low in fat. When Dining Out   Order low-fat appetizers such as fruit or vegetable juice, pasta with vegetables or tomato sauce.  Select clear, rather than cream soups.  Ask that dressings and gravies be served on the side. Then use less of them.  Order foods that are baked, broiled, poached, steamed, stir-fried, or roasted.  Ask for margarine instead of butter, and use only a small amount.  Drink sparkling water, unsweetened tea or coffee, or diet soft drinks instead of alcohol or other sweet beverages. QUESTIONS AND ANSWERS ABOUT OTHER FATS IN THE BLOOD: SATURATED FAT, TRANS FAT, AND CHOLESTEROL What is trans fat? Trans fat is a type of fat that is formed when vegetable oil is hardened through a process called hydrogenation. This process helps makes foods more solid, gives them shape, and  prolongs their shelf life. Trans fats are also called hydrogenated or partially hydrogenated oils.  What do saturated fat, trans fat, and cholesterol in foods have to do with heart disease? Saturated fat, trans fat, and cholesterol in the diet all raise the level of LDL "bad" cholesterol in the blood. The higher the  LDL cholesterol, the greater the risk for coronary heart disease (CHD). Saturated fat and trans fat raise LDL similarly.  What foods contain saturated fat, trans fat, and cholesterol? High amounts of saturated fat are found in animal products, such as fatty cuts of meat, chicken skin, and full-fat dairy products like butter, whole milk, cream, and cheese, and in tropical vegetable oils such as palm, palm kernel, and coconut oil. Trans fat is found in some of the same foods as saturated fat, such as vegetable shortening, some margarines (especially hard or stick margarine), crackers, cookies, baked goods, fried foods, salad dressings, and other processed foods made with partially hydrogenated vegetable oils. Small amounts of trans fat also occur naturally in some animal products, such as milk products, beef, and lamb. Foods high in cholesterol include liver, other organ meats, egg yolks, shrimp, and full-fat dairy products. How can I use the new food label to make heart-healthy food choices? Check the Nutrition Facts panel of the food label. Choose foods lower in saturated fat, trans fat, and cholesterol. For saturated fat and cholesterol, you can also use the Percent Daily Value (%DV): 5% DV or less is low, and 20% DV or more is high. (There is no %DV for trans fat.) Use the Nutrition Facts panel to choose foods low in saturated fat and cholesterol, and if the trans fat is not listed, read the ingredients and limit products that list shortening or hydrogenated or partially hydrogenated vegetable oil, which tend to be high in trans fat. POINTS TO REMEMBER:   Discuss your risk for heart disease  with your caregivers, and take steps to reduce risk factors.  Change your diet. Choose foods that are low in saturated fat, trans fat, and cholesterol.  Add exercise to your daily routine if it is not already being done. Participate in physical activity of moderate intensity, like brisk walking, for at least 30 minutes on most, and preferably all days of the week. No time? Break the 30 minutes into three, 10-minute segments during the day.  Stop smoking. If you do smoke, contact your caregiver to discuss ways in which they can help you quit.  Do not use street drugs.  Maintain a normal weight.  Maintain a healthy blood pressure.  Keep up with your blood work for checking the fats in your blood as directed by your caregiver. Document Released: 06/02/2004 Document Revised: 02/14/2012 Document Reviewed: 12/29/2008 [facility] Patient Information 2014 [place], [place].

## 2013-09-25 NOTE — Progress Notes (Signed)
Pre visit review using our clinic review tool, if applicable. No additional management support is needed unless otherwise documented below in the visit note. 

## 2013-10-10 ENCOUNTER — Other Ambulatory Visit (INDEPENDENT_AMBULATORY_CARE_PROVIDER_SITE_OTHER): Payer: Medicare HMO

## 2013-10-10 DIAGNOSIS — E785 Hyperlipidemia, unspecified: Secondary | ICD-10-CM

## 2013-10-10 DIAGNOSIS — E041 Nontoxic single thyroid nodule: Secondary | ICD-10-CM

## 2013-10-10 LAB — CBC WITH DIFFERENTIAL/PLATELET
BASOS ABS: 0 10*3/uL (ref 0.0–0.1)
Basophils Relative: 0.8 % (ref 0.0–3.0)
EOS PCT: 1.7 % (ref 0.0–5.0)
Eosinophils Absolute: 0.1 10*3/uL (ref 0.0–0.7)
HEMATOCRIT: 44.7 % (ref 36.0–46.0)
HEMOGLOBIN: 14.5 g/dL (ref 12.0–15.0)
LYMPHS ABS: 2.1 10*3/uL (ref 0.7–4.0)
LYMPHS PCT: 43.1 % (ref 12.0–46.0)
MCHC: 32.4 g/dL (ref 30.0–36.0)
MCV: 98.9 fl (ref 78.0–100.0)
Monocytes Absolute: 0.3 10*3/uL (ref 0.1–1.0)
Monocytes Relative: 6.2 % (ref 3.0–12.0)
NEUTROS ABS: 2.4 10*3/uL (ref 1.4–7.7)
Neutrophils Relative %: 48.2 % (ref 43.0–77.0)
Platelets: 220 10*3/uL (ref 150.0–400.0)
RBC: 4.52 Mil/uL (ref 3.87–5.11)
RDW: 13.7 % (ref 11.5–14.6)
WBC: 5 10*3/uL (ref 4.5–10.5)

## 2013-10-10 LAB — LIPID PANEL
Cholesterol: 218 mg/dL — ABNORMAL HIGH (ref 0–200)
HDL: 109.7 mg/dL (ref >39.00)
Total CHOL/HDL Ratio: 2
Triglycerides: 44 mg/dL (ref 0.0–149.0)
VLDL: 8.8 mg/dL (ref 0.0–40.0)

## 2013-10-10 LAB — HEPATIC FUNCTION PANEL
ALT: 20 U/L (ref 0–35)
AST: 23 U/L (ref 0–37)
Albumin: 4.3 g/dL (ref 3.5–5.2)
Alkaline Phosphatase: 61 U/L (ref 39–117)
BILIRUBIN DIRECT: 0.1 mg/dL (ref 0.0–0.3)
BILIRUBIN TOTAL: 1.1 mg/dL (ref 0.3–1.2)
TOTAL PROTEIN: 7.5 g/dL (ref 6.0–8.3)

## 2013-10-10 LAB — LDL CHOLESTEROL, DIRECT: LDL DIRECT: 90.8 mg/dL

## 2013-10-10 LAB — URINALYSIS, ROUTINE W REFLEX MICROSCOPIC
BILIRUBIN URINE: NEGATIVE
KETONES UR: 15 — AB
LEUKOCYTES UA: NEGATIVE
NITRITE: NEGATIVE
PH: 5.5 (ref 5.0–8.0)
SPECIFIC GRAVITY, URINE: 1.025 (ref 1.000–1.030)
Total Protein, Urine: NEGATIVE
Urine Glucose: NEGATIVE
Urobilinogen, UA: 0.2 (ref 0.0–1.0)

## 2013-10-10 LAB — BASIC METABOLIC PANEL
BUN: 13 mg/dL (ref 6–23)
CALCIUM: 9.6 mg/dL (ref 8.4–10.5)
CO2: 28 mEq/L (ref 19–32)
CREATININE: 0.5 mg/dL (ref 0.4–1.2)
Chloride: 102 mEq/L (ref 96–112)
GFR: 127.42 mL/min (ref >60.00)
GLUCOSE: 84 mg/dL (ref 70–99)
Potassium: 4.1 mEq/L (ref 3.5–5.1)
Sodium: 138 mEq/L (ref 135–145)

## 2013-10-10 LAB — TSH: TSH: 4.1 u[IU]/mL (ref 0.35–5.50)

## 2013-10-11 ENCOUNTER — Ambulatory Visit
Admission: RE | Admit: 2013-10-11 | Discharge: 2013-10-11 | Disposition: A | Discharge status: Home / Self Care | Payer: Medicare HMO | Source: Ambulatory Visit | Attending: Neurology | Admitting: Neurology

## 2013-10-11 ENCOUNTER — Telehealth: Payer: Self-pay | Admitting: Neurology

## 2013-10-11 DIAGNOSIS — R209 Unspecified disturbances of skin sensation: Secondary | ICD-10-CM

## 2013-10-11 DIAGNOSIS — R9089 Other abnormal findings on diagnostic imaging of central nervous system: Secondary | ICD-10-CM

## 2013-10-11 MED ORDER — GADOBENATE DIMEGLUMINE 529 MG/ML IV SOLN
8.0000 mL | Freq: Once | INTRAVENOUS | Status: AC | PRN
  Administered 2013-10-11: 8 mL via INTRAVENOUS

## 2013-10-11 NOTE — Telephone Encounter (Signed)
Received information from [place]. 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 The patient has a history of a positive ANA associated with joint pain. MRI of the cervical spine as been done in December 2007, and did not show evidence of spinal cord lesions. Degenerative changes were seen at the C4-5 and C5-6 levels with disc bulges as well. MRI of the lumbosacral spine was unremarkable. The patient does have nonspecific white matter changes on a recent MRI the brain, we have recommended a repeat MRI of the cervical spine.

## 2013-10-14 ENCOUNTER — Telehealth: Payer: Self-pay | Admitting: Neurology

## 2013-10-14 NOTE — Telephone Encounter (Signed)
I called patient. The MRI the cervical spine did not show any cord lesions. Mild spondylosis. I discussed the possibility of obtaining a lumbar puncture. The patient is not clear that she wants this, but she wants an answer concerning her sensory alterations now. If this is the case, I recommend having a lumbar puncture. The patient will "think about it", and call office if she decides she wants the study done.

## 2013-10-15 ENCOUNTER — Telehealth: Payer: Self-pay | Admitting: *Deleted

## 2013-10-16 NOTE — Telephone Encounter (Signed)
Spoke with patient and she would like Dr Clarisa KindredWillis's nurse to call her to explain in more details the results of the MR Cervical Spine and had some questions about the spinal tap, has a chronic cough and an hiatal hernia, has trouble lying flat w/o coughing and has a history of aspirating

## 2013-10-17 NOTE — Telephone Encounter (Signed)
Spoke to patient and she explained that she has other issues that she didn't make the doctor aware of.  Because she has trouble lying flat, coughing episodes,  and has had aspirated pneumonia she is hesitant to have a LP.  I told her I would let the doctor know and speak to her about her concerns.

## 2013-10-17 NOTE — Telephone Encounter (Signed)
I called patient. The patient indicates that she cannot tolerate lying down flat because of severe reflux issues. The patient is unable to have a lumbar puncture. We will follow the MRI over time, recheck the MRI the brain in 9 months. The patient will followup in office in April 2015.

## 2013-10-24 ENCOUNTER — Encounter: Payer: Medicare HMO | Admitting: Physician Assistant

## 2013-10-29 ENCOUNTER — Ambulatory Visit (INDEPENDENT_AMBULATORY_CARE_PROVIDER_SITE_OTHER)
Admission: RE | Admit: 2013-10-29 | Discharge: 2013-10-29 | Disposition: A | Discharge status: Home / Self Care | Payer: Medicare HMO | Source: Ambulatory Visit | Attending: Physician Assistant | Admitting: Physician Assistant

## 2013-10-29 ENCOUNTER — Encounter: Payer: Self-pay | Admitting: Physician Assistant

## 2013-10-29 ENCOUNTER — Ambulatory Visit (INDEPENDENT_AMBULATORY_CARE_PROVIDER_SITE_OTHER): Payer: Medicare HMO | Admitting: Physician Assistant

## 2013-10-29 VITALS — BP 90/62 | HR 99 | Temp 97.7°F | Wt 98.0 lb

## 2013-10-29 DIAGNOSIS — M25539 Pain in unspecified wrist: Secondary | ICD-10-CM

## 2013-10-29 DIAGNOSIS — I7300 Raynaud's syndrome without gangrene: Secondary | ICD-10-CM

## 2013-10-29 DIAGNOSIS — M25531 Pain in right wrist: Secondary | ICD-10-CM

## 2013-10-29 NOTE — Progress Notes (Signed)
Subjective:    [name], Catherine Gregory    DOB: 1946-05-01, 68 y.o.   MRN: [id number]  HPI Comments: Patient is a 68 year old Catherine Gregory who presents to the office with complaint of dominant right wrist pain. Reports pain began nearly one week ago. States feels as if she sprained her wrist somehow however, is unaware of injury or trauma to the extremity. Pain radiates into her right thumb. States had been driving the day before in the bad weather and pain started after that. Symptoms increase with extension of the wrist. Has chronic numbness tingling to the fingers. Has tried wrapping with an ace bandage with mild relief of symptoms. Also complains of bilateral fingertips becoming "extremely" white with blue spotting on the surface when she is digging in the freezer looking for items or when washing her hands in cold water. Denies injury or trauma, fever, increased weakness. History significant for myalgia and myositis, disturbance of skin sensation and possible MS.    Review of Systems  Constitutional: Negative for fever and chills.  Musculoskeletal: Negative for joint swelling.       Dominant right wrist pain  Skin: Negative for rash.  Neurological: Positive for numbness (chronic, generalized all over). Negative for weakness.   Past Medical History  Diagnosis Date  . Disturbance of skin sensation 08/13/2013  . Myalgia and myositis, unspecified 08/13/2013  . Depression with anxiety   . GERD (gastroesophageal reflux disease)   . Hiatal hernia   . Malnutrition   . Migraine   . Renal calculus   . Dysphagia, idiopathic   . Abnormal weight loss   . GERD (gastroesophageal reflux disease)   . COPD (chronic obstructive pulmonary disease)   . Elevated antinuclear antibody (ANA) level   . Vitamin D deficiency   . Idiopathic peripheral neuropathy   . Cachexia   . Abdominal pain, chronic, generalized   . Constipation   . Vitamin B12 deficiency   . Right thyroid nodule   .  Lymphadenopathy, inguinal   . Spleen enlarged   . Abdominal pain, RUQ   . Nephrocalcinosis   . Microscopic hematuria   . Hyperlipidemia        Objective:   Physical Exam  Vitals reviewed. Constitutional: She is oriented to person, place, and time.  Thin, appears stated age, seated comfortably on exam room chair. NAD  HENT:  Head: Normocephalic and atraumatic.  Left Ear: External ear normal.  Eyes: Conjunctivae are normal.  Neck: Normal range of motion.  Cardiovascular: Normal rate and regular rhythm.  Exam reveals no gallop and no friction rub.   No murmur heard. Pulses:      Radial pulses are 2+ on the right side, and 2+ on the left side.  Pulmonary/Chest: Effort normal and breath sounds normal. She has no wheezes. She has no rales.  Musculoskeletal:  Right wrist with FROM, mild tenderness noted to extensor policus longus, no snuffbox tenderness, no pain with axial load of thumb. No edema, erythema, rash or ecchymosis. No decrease in strength.  DNVI. Negative tinels and phalens.  Neurological: She is alert and oriented to person, place, and time. GCS eye subscore is 4. GCS verbal subscore is 5. GCS motor subscore is 6.  Skin: Skin is warm and dry. She is not diaphoretic.  Psychiatric: She has a normal mood and affect.     Lab Results  Component Value Date   WBC 5.0 10/10/2013   HGB 14.5 10/10/2013   HCT 44.7 10/10/2013  PLT 220.0 10/10/2013   GLUCOSE 84 10/10/2013   CHOL 218* 10/10/2013   TRIG 44.0 10/10/2013   HDL 109.70 10/10/2013   LDLDIRECT 90.8 10/10/2013   ALT 20 10/10/2013   AST 23 10/10/2013   NA 138 10/10/2013   K 4.1 10/10/2013   CL 102 10/10/2013   CREATININE 0.5 10/10/2013   BUN 13 10/10/2013   CO2 28 10/10/2013   TSH 4.10 10/10/2013   INR 0.9 05/05/2009   Filed Vitals:   10/29/13 0810  BP: 90/62  Pulse: 99  Temp: 97.7 F (36.5 C)       Assessment & Plan:   Wrist pain: Xray today Believe to be sprain related, patient instructed can continue wearing ace  bandage especially when activities involve any extension of the wrist.  RTO if no improvement of symptoms, may refer to Dr. Katrinka BlazingSmith for evaluation.  Raynaud's Phenomenon Counseled patient on wearing gloves when needing to access the freezer or refrigerator. Increase exercise to improve circulation. No medications, CCB, patient with lower Bp, 90/62

## 2013-10-29 NOTE — Progress Notes (Signed)
Pre-visit discussion using our clinic review tool. No additional management support is needed unless otherwise documented below in the visit note.  

## 2013-10-29 NOTE — Patient Instructions (Addendum)
Nice to see you again today Ms Catherine Gregory!  I have ordered an xray of your right wrist. Please report to xray department in the basement.   Can continue to wrap wrist with ace wrap for relief of symptoms.     Raynaud's Syndrome Raynaud's Syndrome is a disorder of the blood vessels in your hands and feet. It occurs when small arteries of the arms/hands or legs/feet become sensitive to cold or emotional upset. This causes the arteries to constrict, or narrow, and reduces blood flow to the area. The color in the fingers or toes changes from white to bluish to red and this is not usually painful. There may be numbness and tingling. Sores on the skin (ulcers) can form. Symptoms are usually relieved by warming. HOME CARE INSTRUCTIONS   Avoid exposure to cold. Keep your whole body warm and dry. Dress in layers. Wear mittens or gloves when handling ice or frozen food and when outdoors. Use holders for glasses or cans containing cold drinks. If possible, stay indoors during cold weather.  Limit your use of caffeine. Switch to decaffeinated coffee, tea, and soda pop. Avoid chocolate.  Avoid smoking or being around cigarette smoke. Smoke will make symptoms worse.  Wear loose fitting socks and comfortable, roomy shoes.  Avoid vibrating tools and machinery.  If possible, avoid stressful and emotional situations. Exercise, meditation and yoga may help you cope with stress. Biofeedback may be useful.  Ask your caregiver about medicine (calcium channel blockers) that may control Raynaud's phenomena. SEEK MEDICAL CARE IF:   Your discomfort becomes worse, despite conservative treatment.  You develop sores on your fingers and toes that do not heal. Document Released: 08/12/2000 Document Revised: 11/07/2011 Document Reviewed: 08/19/2008 [facility] Patient Information 2014 [place].

## 2013-11-22 ENCOUNTER — Other Ambulatory Visit (HOSPITAL_COMMUNITY)
Admission: RE | Admit: 2013-11-22 | Discharge: 2013-11-22 | Disposition: A | Discharge status: Home / Self Care | Payer: Medicare HMO | Source: Ambulatory Visit | Attending: Medical | Admitting: Medical

## 2013-11-22 ENCOUNTER — Other Ambulatory Visit: Payer: Self-pay | Admitting: Medical

## 2013-11-22 ENCOUNTER — Encounter: Payer: Self-pay | Admitting: Medical

## 2013-11-22 ENCOUNTER — Ambulatory Visit (INDEPENDENT_AMBULATORY_CARE_PROVIDER_SITE_OTHER): Payer: Medicare HMO | Admitting: Medical

## 2013-11-22 VITALS — BP 109/67 | HR 94 | Temp 97.0°F | Ht 65.0 in | Wt 97.1 lb

## 2013-11-22 DIAGNOSIS — Z780 Asymptomatic menopausal state: Secondary | ICD-10-CM

## 2013-11-22 DIAGNOSIS — Z1231 Encounter for screening mammogram for malignant neoplasm of breast: Secondary | ICD-10-CM

## 2013-11-22 DIAGNOSIS — N951 Menopausal and female climacteric states: Secondary | ICD-10-CM

## 2013-11-22 DIAGNOSIS — Z01419 Encounter for gynecological examination (general) (routine) without abnormal findings: Secondary | ICD-10-CM

## 2013-11-22 DIAGNOSIS — Z1151 Encounter for screening for human papillomavirus (HPV): Secondary | ICD-10-CM | POA: Insufficient documentation

## 2013-11-22 DIAGNOSIS — Z124 Encounter for screening for malignant neoplasm of cervix: Secondary | ICD-10-CM | POA: Insufficient documentation

## 2013-11-22 MED ORDER — ESTRADIOL 0.025 MG/24HR TD PTWK
0.0250 mg | MEDICATED_PATCH | TRANSDERMAL | Status: DC
Start: 2013-11-22 — End: 2014-02-03

## 2013-11-22 NOTE — Progress Notes (Signed)
Patient ID: Catherine GeorgesKatherine Peggy Mahar, female   DOB: Jun 29, 1946, 68 y.o.   MRN: [id number] Subjective:    Catherine Gregory is a 68 y.o. female who presents for annual exam. The patient has no complaints today. The patient is not currently sexually active. GYN screening history: last pap: approximate date > 1 year ago and was normal. The patient is taking hormone replacement therapy. Patient denies post-menopausal vaginal bleeding.. The patient wears seatbelts: yes. The patient participates in regular exercise: yes. Has the patient ever been transfused or tattooed?: no. The patient reports that there is not domestic violence in her life. The patient has a letter from her insurance company today requesting a letter to approve the use of her estradiol patches.    Menstrual History: OB History   Grav Para Term Preterm Abortions TAB SAB Ect Mult Living   2 2              No LMP recorded. Patient has had a hysterectomy.     The following portions of the patient's history were reviewed and updated as appropriate: allergies, current medications, past family history, past medical history, past social history, past surgical history and problem list.  Review of Systems Pertinent items are noted in HPI.    Objective:     BP 109/67  Pulse 94  Temp(Src) 97 F (36.1 C) (Oral)  Ht 5\' 5"  (1.651 m)  Wt 97 lb 1.6 oz (44.044 kg)  BMI 16.16 kg/m2 GENERAL: Well-developed, well-nourished female in no acute distress.  HEENT: Normocephalic, atraumatic.  LUNGS: Clear to auscultation bilaterally.  HEART: Regular rate and rhythm. BREASTS: Symmetric in size. No masses, skin changes, nipple drainage, or lymphadenopathy. ABDOMEN: Soft, nontender, nondistended. No organomegaly. PELVIC: Normal external female genitalia. Vagina is pink and rugated.  Normal discharge. Normal cervix contour. Pap smear obtained. No tenderness on bimanual exam EXTREMITIES: No cyanosis, clubbing, or edema       Assessment:    Normal  post-menopausal GYN exam    Plan:    1. Pap smear done today. Patient will be contacted with any abnormal results  2. Patient advised to follow-up with PCP for other chronic medical conditions 3. Mammogram scheduled 4. Spoke with [employer]. They will send a form by fax for us to complete to authorize the medication requested by the patient 5. Patient to follow-up with [facility] in 1 year for annual exam or sooner PRN  Freddi StarrJulie N Ethier, PA-C  11/22/2013 9:08 AM

## 2013-11-22 NOTE — Patient Instructions (Signed)

## 2013-11-26 ENCOUNTER — Telehealth: Payer: Self-pay

## 2013-11-26 ENCOUNTER — Encounter: Payer: Medicare HMO | Admitting: Physician Assistant

## 2013-11-26 NOTE — Telephone Encounter (Signed)
Called pt. And informed her of the need to repeat pap due to insufficient tissue. Pt. Verbalized understanding and stated she was told that may need to be done again by Raynelle Fanning. Informed her that the front office staff will call her with an appointment. Pt. Verbalized understanding and gratitude and had no further questions or concerns.

## 2013-11-26 NOTE — Telephone Encounter (Signed)
Message copied by Louanna Raw on Tue Nov 26, 2013  8:40 AM ------      Message from: Freddi Starr      Created: Mon Nov 25, 2013  4:42 PM       No transformation zone noted. Very difficult pap. Please schedule for re-pap no charge. Thanks! ------

## 2013-11-27 ENCOUNTER — Ambulatory Visit (HOSPITAL_COMMUNITY)
Admission: RE | Admit: 2013-11-27 | Discharge: 2013-11-27 | Disposition: A | Discharge status: Home / Self Care | Payer: Medicare HMO | Source: Ambulatory Visit | Attending: Medical | Admitting: Medical

## 2013-11-27 DIAGNOSIS — Z1231 Encounter for screening mammogram for malignant neoplasm of breast: Secondary | ICD-10-CM

## 2013-12-16 ENCOUNTER — Ambulatory Visit: Payer: Medicare Other | Admitting: Nurse Practitioner

## 2013-12-18 ENCOUNTER — Ambulatory Visit (INDEPENDENT_AMBULATORY_CARE_PROVIDER_SITE_OTHER): Payer: Medicare HMO | Admitting: Nurse Practitioner

## 2013-12-18 ENCOUNTER — Encounter: Payer: Self-pay | Admitting: Nurse Practitioner

## 2013-12-18 VITALS — BP 114/64 | HR 95 | Ht 65.0 in | Wt 98.0 lb

## 2013-12-18 DIAGNOSIS — IMO0001 Reserved for inherently not codable concepts without codable children: Secondary | ICD-10-CM

## 2013-12-18 DIAGNOSIS — R209 Unspecified disturbances of skin sensation: Secondary | ICD-10-CM

## 2013-12-18 DIAGNOSIS — R930 Abnormal findings on diagnostic imaging of skull and head, not elsewhere classified: Secondary | ICD-10-CM

## 2013-12-18 DIAGNOSIS — R9089 Other abnormal findings on diagnostic imaging of central nervous system: Secondary | ICD-10-CM

## 2013-12-18 NOTE — Patient Instructions (Signed)
Dr. Anne Hahn recommended a follow up MRI Brain for 6-8 months following the first one.  Please call the office when you would like to schedule this.  I recommend having a Rheumatological Evaluation since you have the history of elevated ANA and Fibromyalgia.  Please have your Primary Physician set up this referral for you, and I will send my recommendation to Baltazar Apo for this.

## 2013-12-18 NOTE — Progress Notes (Signed)
PATIENT: Catherine GeorgesKatherine Peggy Gregory DOB: March 20, 1946  REASON FOR VISIT: follow up for disturbance of skin sensation, numbness HISTORY FROM: patient  HISTORY OF PRESENT ILLNESS: 08/13/13 Catherine Gregory(KW):  Catherine Gregory is a 68 year old right-handed white female with a history of fibromyalgia that dates back 10/17/1996. The patient indicates that she has some discomfort in the legs, arms, and body. The patient has pain in the knees, shoulders, thighs and hips. The pain does vary some day-to-day, with some migratory features. The patient has also had some patchy numbness that goes back to 1998 as well, again on the arms and legs with some migratory features. Within the last 6 weeks, the patient has had some migration of the numbness on the lower face up to the eye level bilaterally. The patient indicates that she has had some facial numbness in the lower face for number of years. The patient indicates that her fibromyalgia-type symptoms began after a hysterectomy in 1998. The patient does have some gait instability, but she denies problems controlling the bowels or the bladder. The patient has soreness of the feet, and she describes sharp lancinating pains that may occur throughout the body. The patient may have some achy sensations as well. The patient may have occasional headaches that she attributes to a sinus type headache. The patient has some generalized fatigue, no focal weakness. The patient denies any vision changes. In the past, the patient indicates that she has been told that she had an abnormal MRI the brain, and it was felt that she could have multiple sclerosis. Treatment was recommended, but the patient never initiated treatment for MS. The patient refused to have a lumbar puncture. [place] The patient currently has no primary care physician. The patient comes to this office for an evaluation. Recent blood work has been done that includes  a vitamin B12 level, thyroid profile, chemistry profile that were unremarkable, the results were reviewed. The patient indicates that she has a history of a peripheral neuropathy, but it is not clear what studies have been done to confirm this. The patient also reports that she coughs with eating and swallowing, and prior swallow studies have been unremarkable.   Received information from [place]. 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 The patient has a history of a positive ANA associated with joint pain. MRI of the cervical spine as been done in December 2007, and did not show evidence of spinal cord lesions. Degenerative changes were seen at the C4-5 and C5-6 levels with disc bulges as well. MRI of the lumbosacral spine was unremarkable. The patient does have nonspecific white matter changes on a recent MRI the brain, we have recommended a repeat MRI of the cervical spine. Her recent MRI the cervical spine did not show any cord lesions. Mild spondylosis. I discussed the possibility of obtaining a lumbar puncture. The patient is not clear that she wants this, but she wants an answer concerning her sensory alterations now. If this is the case, I recommend having a lumbar puncture. The patient will "think about it", and call office if she decides she wants the study done. The patient indicates that she cannot tolerate lying down flat because of severe reflux issues. The patient is unable to have a lumbar puncture. We will follow the MRI over time, recheck the MRI the brain in 9 months.   UPDATE 12/18/13 (LL):  Catherine Gregory returns to the office for follow up.  Since last visit, her MRI brain showed few nonspecific white matter lesions, but no progression since scan in 2007.  She is concerned for possibility of MS diagnosis but is unable to have lumbar puncture (see notes above).  She has had slight worsening in symptoms of numbness in  several areas, including her face, anterior neck, and right flank and buttock. She has difficulty with strangling/coughing when eating. She also notes that she has some drooling at times at the corners of her mouth.  She states her position sense in the dark is poor and she looses her balance easily.  She states that she has had a trauma in her life in the past year, concerning having to move from her daughter's home in [place] to her brother's home here in [place].  Her daughter's family is moving to [place] in the coming months and she plans to move there to live with them in the later part of this year but is unsure when. She has a sibling who died from complications from MS and a brother with Rheumatoid Arthritis.  ROS:  Out of a complete 14 system review of symptoms, the patient complains only of the following symptoms, and all other reviewed systems are negative.  Fatigue, chills  Difficulty swallowing, drooling  Itching sensations  Eye pain  Cough, wheezing  Easy bruising  cold intolerance Joint pain, achy muscles  Allergies, runny nose, skin sensitivity  Headache, numbness Muscle cramps, walking difficulty, neck pain, neck stiffness   ALLERGIES: Allergies  Allergen Reactions  . Prednisone   . Albuterol   . Benadryl [Diphenhydramine Hcl]   . Doxycycline   . Ducodyl [Bisacodyl]   . Erythromycin   . Levaquin [Levofloxacin In D5w]     HOME MEDICATIONS: Outpatient Prescriptions Prior to Visit  Medication Sig Dispense Refill  . Cholecalciferol (VITAMIN D3) LIQD 4,000 Units by Does not apply route daily.       Marland Kitchen estradiol (CLIMARA - DOSED IN MG/24 HR) 0.025 mg/24hr patch Place 1 patch (0.025 mg total) onto the skin once a week.  4 patch  11   No facility-administered medications prior to visit.   PHYSICAL EXAM  Filed Vitals:   12/18/13 0859  BP: 114/64  Pulse: 95  Height: 5\' 5"  (1.651 m)  Weight: 98 lb (44.453 kg)   Body mass index is 16.31 kg/(m^2).  Physical Exam    General: The patient is alert and cooperative at the time of the examination. Very thin, frail appearing female Head: Pupils are equal, round, and reactive to light. Discs are flat bilaterally.  Neck: The neck is supple, no carotid bruits are noted.  Respiratory: The respiratory examination is clear.  Cardiovascular: The cardiovascular examination reveals a regular rate and rhythm, no obvious murmurs or rubs are noted.  Skin: Extremities are without significant edema.    Neurologic Exam  Mental status: The patient is alert and oriented x 3 at the time of the examination.  Cranial nerves: Facial symmetry is present. There is good sensation of the face to pinprick and soft touch bilaterally. The strength of the facial muscles and the muscles to head turning and shoulder shrug are normal bilaterally. Speech is well enunciated, no aphasia or dysarthria is noted. Extraocular movements are full. Visual fields are full.  Motor: The motor testing reveals 5 over 5 strength of all 4 extremities. Good symmetric motor tone is noted throughout.  Sensory: Sensory testing is intact to pinprick,  soft touch, vibration sensation, and position sense on all 4 extremities, with exception that pinprick sensation is decreased on the left leg. No evidence of extinction is noted.  Coordination: Cerebellar testing reveals good finger-nose-finger and heel-to-shin bilaterally.  Gait and station: Gait is normal. Tandem gait is slightly unsteady. Romberg is negative. No drift is seen.  Reflexes: Deep tendon reflexes are symmetric, but are depressed bilaterally. Toes are downgoing bilaterally.   DIAGNOSTIC DATA (LABS, IMAGING, TESTING) - I reviewed patient records, labs, notes, testing and imaging myself where available.  Lab Results  Component Value Date   TSH 4.10 10/10/2013   Lyme neg, ACE neg, B1 negative, Serum Copper neg, ANA w/reflex negative,  ASSESSMENT AND PLAN 68 y.o. year old female  has a past medical  history of Disturbance of skin sensation (08/13/2013); Myalgia and myositis, unspecified (08/13/2013); Depression with anxiety; GERD (gastroesophageal reflux disease); Hiatal hernia; Malnutrition; Migraine; Renal calculus; Dysphagia, idiopathic; Abnormal weight loss; GERD (gastroesophageal reflux disease); COPD (chronic obstructive pulmonary disease); Elevated antinuclear antibody (ANA) level; Vitamin D deficiency; Idiopathic peripheral neuropathy; Cachexia; Abdominal pain, chronic, generalized; Constipation; Vitamin B12 deficiency; Right thyroid nodule; Lymphadenopathy, inguinal; Spleen enlarged; Abdominal pain, RUQ; Nephrocalcinosis; Microscopic hematuria; and Hyperlipidemia here with:  1. Fibromyalgia  2. Migratory sensory alteration  The patient has recently moved to this area. The patient has long-standing history of migratory pain, sensory changes, and some history of an abnormal MRI of the brain. Migratory sensory phenomena maybe associated with the fibromyalgia syndrome. The patient does not wish to have any medications for her fibromyalgia. MRI brain shows nonspecific white matter changes, MRI cervical spine shows spondylosis.  We will follow MRI brain for changes or progression for white matter lesions; patient is unable to tolerate position for lumbar puncture.  She may benefit from evaluation and treatment from Rheumatologist, for fibromyalgia and eval for possible rheumatological conditions.    She will call the office when she is ready to schedule follow up MRI Arlys John.  Order has been entered expected in June/July.  Orders Placed This Encounter  Procedures  . MR Brain W Wo Contrast   Return if symptoms worsen or fail to improve.  Ronal Fear, MSN, NP-C 12/18/2013, 9:57 AM [facility] [address], [address] [place], [place] [address] [phone]  Note: This document was prepared with digital dictation and possible smart phrase technology. Any transcriptional errors  that result from this process are unintentional.

## 2013-12-18 NOTE — Progress Notes (Signed)
I have read the note, and I agree with the clinical assessment and plan.  Charles K Willis   

## 2013-12-31 ENCOUNTER — Telehealth: Payer: Self-pay | Admitting: Internal Medicine

## 2013-12-31 NOTE — Telephone Encounter (Signed)
Please work her into my schedule 

## 2013-12-31 NOTE — Telephone Encounter (Signed)
Pt is a transfer from [facility].  She has an appt on May 21 with Dr. Yetta Barre.  She wants a referral to Endocrinology for Thyroid problems.  She has a nodule. She had thyroiditis in the past.  She is having symptoms that she thinks could be from thyroid.  Sweating, gets cold, pain in neck and jaw.   Dr. Clarisa Kindred NP wants her to see a rheumatologist for fibromyalgia.

## 2014-01-01 NOTE — Telephone Encounter (Signed)
LMOM to call back

## 2014-01-03 NOTE — Telephone Encounter (Signed)
Appt on May 12 with Dr. Yetta BarreJones.

## 2014-01-07 ENCOUNTER — Ambulatory Visit: Payer: Medicare HMO | Admitting: Internal Medicine

## 2014-01-10 ENCOUNTER — Ambulatory Visit (INDEPENDENT_AMBULATORY_CARE_PROVIDER_SITE_OTHER): Payer: Medicare HMO | Admitting: Internal Medicine

## 2014-01-10 ENCOUNTER — Encounter: Payer: Self-pay | Admitting: Internal Medicine

## 2014-01-10 VITALS — BP 128/72 | HR 101 | Temp 98.0°F | Resp 16 | Ht 65.0 in | Wt 101.0 lb

## 2014-01-10 DIAGNOSIS — IMO0001 Reserved for inherently not codable concepts without codable children: Secondary | ICD-10-CM

## 2014-01-10 DIAGNOSIS — E041 Nontoxic single thyroid nodule: Secondary | ICD-10-CM

## 2014-01-10 DIAGNOSIS — G609 Hereditary and idiopathic neuropathy, unspecified: Secondary | ICD-10-CM

## 2014-01-10 NOTE — Progress Notes (Signed)
Pre visit review using our clinic review tool, if applicable. No additional management support is needed unless otherwise documented below in the visit note. 

## 2014-01-10 NOTE — Patient Instructions (Signed)

## 2014-01-10 NOTE — Progress Notes (Signed)
Subjective:    [name], female    DOB: Feb 28, 1946, 68 y.o.   MRN: [id number]  HPI Comments: New to me she has a 20 year history of bizarre and unusual symptoms that have never been specifically diagnosed - see recent neuro note for further details. She recently saw a NP in neuro and was told that she needs referrals to endo (thryoid nodule) and rheum for fibromyalgia, she has no new or worsening symptoms today.     Review of Systems  Constitutional: Positive for fatigue and unexpected weight change. Negative for fever, chills, diaphoresis, activity change and appetite change.  HENT: Positive for congestion, trouble swallowing and voice change (chronic laryngitis).   Eyes: Negative.   Respiratory: Negative.  Negative for cough, choking, chest tightness, shortness of breath, wheezing and stridor.   Cardiovascular: Negative.  Negative for chest pain, palpitations and leg swelling.  Gastrointestinal: Negative.  Negative for nausea, vomiting, abdominal pain, diarrhea, constipation and blood in stool.  Endocrine: Negative.   Genitourinary: Negative.   Musculoskeletal: Positive for myalgias, neck pain and neck stiffness. Negative for arthralgias, back pain, gait problem and joint swelling.       She has a stabbing pain over the front of her neck and into her face.  Skin: Negative.   Allergic/Immunologic: Negative.   Neurological: Positive for dizziness, tremors, numbness and headaches. Negative for seizures, syncope, facial asymmetry, speech difficulty, weakness and light-headedness.  Hematological: Negative.  Negative for adenopathy. Does not bruise/bleed easily.  Psychiatric/Behavioral: Positive for dysphoric mood. Negative for suicidal ideas, confusion, sleep disturbance, self-injury, decreased concentration and agitation. The patient is not nervous/anxious.        Objective:   Physical Exam  Vitals reviewed. Constitutional: She is oriented to person, place, and  time.  Non-toxic appearance. She has a sickly appearance. She does not appear ill. She appears distressed (very thin).  HENT:  Head: Normocephalic and atraumatic.  Mouth/Throat: Oropharynx is clear and moist and mucous membranes are normal. Mucous membranes are not pale, not dry and not cyanotic. No oral lesions. No trismus in the jaw. No uvula swelling. No oropharyngeal exudate, posterior oropharyngeal edema or posterior oropharyngeal erythema.  Eyes: Conjunctivae are normal. Right eye exhibits no discharge. Left eye exhibits no discharge. No scleral icterus.  Neck: Trachea normal and normal range of motion. Neck supple. No JVD present. No spinous process tenderness and no muscular tenderness present. No rigidity. No tracheal deviation, no edema and normal range of motion present. No Brudzinski's sign and no Kernig's sign noted. No mass and no thyromegaly present.  Cardiovascular: Normal rate, regular rhythm, normal heart sounds and intact distal pulses.  Exam reveals no gallop and no friction rub.   No murmur heard. Pulmonary/Chest: Effort normal and breath sounds normal. No stridor. No respiratory distress. She has no wheezes. She has no rales. She exhibits no tenderness.  Abdominal: Soft. Bowel sounds are normal. She exhibits no distension and no mass. There is no tenderness. There is no rebound and no guarding.  Musculoskeletal: Normal range of motion. She exhibits no edema and no tenderness.  Lymphadenopathy:    She has no cervical adenopathy.  Neurological: She is alert and oriented to person, place, and time. She has normal reflexes. She displays normal reflexes. No cranial nerve deficit. She exhibits normal muscle tone. Coordination normal.  Skin: Skin is warm and dry. No rash noted. She is not diaphoretic. No erythema. No pallor.  Psychiatric: Judgment and thought content normal. Her mood  appears not anxious. Her affect is not angry, not blunt, not labile and not inappropriate. Her speech is  delayed and tangential. Her speech is not rapid and/or pressured and not slurred. She is slowed. She is not withdrawn and not actively hallucinating. Cognition and memory are normal. She exhibits a depressed mood. She is communicative. She is attentive.     Lab Results  Component Value Date   WBC 5.0 10/10/2013   HGB 14.5 10/10/2013   HCT 44.7 10/10/2013   PLT 220.0 10/10/2013   GLUCOSE 84 10/10/2013   CHOL 218* 10/10/2013   TRIG 44.0 10/10/2013   HDL 109.70 10/10/2013   LDLDIRECT 90.8 10/10/2013   ALT 20 10/10/2013   AST 23 10/10/2013   NA 138 10/10/2013   K 4.1 10/10/2013   CL 102 10/10/2013   CREATININE 0.5 10/10/2013   BUN 13 10/10/2013   CO2 28 10/10/2013   TSH 4.10 10/10/2013   INR 0.9 05/05/2009       Assessment & Plan:

## 2014-01-11 NOTE — Assessment & Plan Note (Signed)
She has had neuropathy diagnosis for many years but now there is concern about MS, I am also concerned about dementia, depression, personality disorder, parkinson's disease, etc, she wants to get a second opinion for neurology so I have sent a referral

## 2014-01-11 NOTE — Assessment & Plan Note (Signed)
Will refer to rheum as requested

## 2014-01-11 NOTE — Assessment & Plan Note (Signed)
I do not feel a nodule today but will refer to ENDO as requested

## 2014-01-16 ENCOUNTER — Encounter: Payer: Self-pay | Admitting: Internal Medicine

## 2014-01-16 ENCOUNTER — Ambulatory Visit (INDEPENDENT_AMBULATORY_CARE_PROVIDER_SITE_OTHER): Payer: Medicare HMO | Admitting: Internal Medicine

## 2014-01-16 VITALS — BP 104/68 | HR 85 | Temp 97.0°F | Resp 16 | Ht 66.0 in | Wt 99.1 lb

## 2014-01-16 DIAGNOSIS — N816 Rectocele: Secondary | ICD-10-CM

## 2014-01-16 NOTE — Progress Notes (Signed)
   Subjective:    [name], female    DOB: December 22, 1945, 68 y.o.   MRN: [id number]  HPI Comments: She returns and tells me that she thinks there is a hemorrhoid or a recurrent rectocele problem. She has mild anal discomfort when she strains for a BM.     Review of Systems  Constitutional: Negative.   HENT: Negative.   Eyes: Negative.   Respiratory: Negative.   Cardiovascular: Negative.  Negative for chest pain, palpitations and leg swelling.  Gastrointestinal: Positive for rectal pain. Negative for nausea, vomiting, abdominal pain, diarrhea, constipation and blood in stool.  Endocrine: Negative.   Genitourinary: Negative.   Musculoskeletal: Negative.   Skin: Negative.   Allergic/Immunologic: Negative.   Neurological: Negative.   Hematological: Negative.  Negative for adenopathy. Does not bruise/bleed easily.  Psychiatric/Behavioral: Negative.        Objective:   Physical Exam  Vitals reviewed. Constitutional: She is oriented to person, place, and time. She appears well-developed and well-nourished.  Non-toxic appearance. She does not have a sickly appearance. She does not appear ill. No distress.  HENT:  Head: Normocephalic and atraumatic.  Mouth/Throat: Oropharynx is clear and moist. No oropharyngeal exudate.  Eyes: Conjunctivae are normal. Right eye exhibits no discharge. Left eye exhibits no discharge. No scleral icterus.  Neck: Normal range of motion. Neck supple. No JVD present. No tracheal deviation present. No thyromegaly present.  Cardiovascular: Normal rate, regular rhythm, normal heart sounds and intact distal pulses.  Exam reveals no gallop and no friction rub.   No murmur heard. Pulmonary/Chest: Effort normal and breath sounds normal. No stridor. No respiratory distress. She has no wheezes. She has no rales. She exhibits no tenderness.  Abdominal: Soft. Bowel sounds are normal. She exhibits no distension and no mass. There is no tenderness.  There is no rebound and no guarding.  Genitourinary:  She refused a rectal exam today  Musculoskeletal: Normal range of motion. She exhibits no edema and no tenderness.  Lymphadenopathy:    She has no cervical adenopathy.  Neurological: She is oriented to person, place, and time.  Skin: Skin is warm and dry. No rash noted. She is not diaphoretic. No erythema. No pallor.     Lab Results  Component Value Date   WBC 5.0 10/10/2013   HGB 14.5 10/10/2013   HCT 44.7 10/10/2013   PLT 220.0 10/10/2013   GLUCOSE 84 10/10/2013   CHOL 218* 10/10/2013   TRIG 44.0 10/10/2013   HDL 109.70 10/10/2013   LDLDIRECT 90.8 10/10/2013   ALT 20 10/10/2013   AST 23 10/10/2013   NA 138 10/10/2013   K 4.1 10/10/2013   CL 102 10/10/2013   CREATININE 0.5 10/10/2013   BUN 13 10/10/2013   CO2 28 10/10/2013   TSH 4.10 10/10/2013   INR 0.9 05/05/2009       Assessment & Plan:

## 2014-01-16 NOTE — Patient Instructions (Signed)

## 2014-01-16 NOTE — Progress Notes (Signed)
Pre visit review using our clinic review tool, if applicable. No additional management support is needed unless otherwise documented below in the visit note. 

## 2014-01-20 ENCOUNTER — Encounter: Payer: Self-pay | Admitting: Internal Medicine

## 2014-01-21 ENCOUNTER — Ambulatory Visit: Payer: Medicare HMO | Admitting: Internal Medicine

## 2014-01-30 ENCOUNTER — Ambulatory Visit (INDEPENDENT_AMBULATORY_CARE_PROVIDER_SITE_OTHER): Payer: Medicare HMO | Admitting: Internal Medicine

## 2014-01-30 ENCOUNTER — Encounter: Payer: Self-pay | Admitting: Internal Medicine

## 2014-01-30 VITALS — BP 106/64 | HR 100 | Temp 97.3°F | Resp 12 | Ht 66.0 in | Wt 98.9 lb

## 2014-01-30 DIAGNOSIS — Z1329 Encounter for screening for other suspected endocrine disorder: Secondary | ICD-10-CM

## 2014-01-30 DIAGNOSIS — R208 Other disturbances of skin sensation: Secondary | ICD-10-CM | POA: Insufficient documentation

## 2014-01-30 DIAGNOSIS — R209 Unspecified disturbances of skin sensation: Secondary | ICD-10-CM

## 2014-01-30 LAB — T3, FREE: T3 FREE: 2.7 pg/mL (ref 2.3–4.2)

## 2014-01-30 LAB — TSH: TSH: 0.96 u[IU]/mL (ref 0.35–4.50)

## 2014-01-30 LAB — T4, FREE: Free T4: 0.92 ng/dL (ref 0.60–1.60)

## 2014-01-30 NOTE — Patient Instructions (Signed)
Consider seeing a toxicologist. Please stop at the lab. I will send you the results through MyChart. We will schedule a new appt if labs are abnormal.

## 2014-01-30 NOTE — Progress Notes (Signed)
[name], female   DOB: 1945-12-31, 68 y.o.   MRN: [id number]   HPI  Catherine Gregory is a 68 y.o.-year-old female, referred by her PCP, Dr. Yetta Barre, in consultation for ?Thyroid dysfunction in the context of dysesthesia.  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 She also had swallowing problems, hoarseness >> had thyroid investigation: U/S, TFTs. I reviewed her Thyroid U/S (12/15/2011): no thyroid nodule, possible acute vs chronic mild thyroiditis  6 mo ago, she started to have the following sxs: - numbness advanced to neck and face  - cannot feel the L side of neck; nose hurts, eyes hurt; sxs fluctuate in intensity - she feels she break out in a sweat >> then cold.  - also tremors - low body temperature  I reviewed pt's thyroid tests: Lab Results  Component Value Date   TSH 4.10 10/10/2013    Pt denies feeling nodules in neck, + hoarseness, + dysphagia/+ occasionally choking/ no odynophagia, + SOB with lying down. Has had aspiration PNA several times, has a hiatal hernia, has GERD. She had 4-5 EGDs.   Pt c/o: - + fatigue - + heat intolerance/cold intolerance (see above) - + tremors - no palpitations - no anxiety/depression - + N/V/D/C/reflux - + weight loss (over time) - has a very healthy diet - no recently hair falling  Pt does not have a FH of thyroid ds. No FH of thyroid cancer. No h/o radiation tx to head or neck. No seaweed or kelp, no recent contrast studies. No steroid use. No herbal supplements.   She had testing for adrenal insufficiency >> normal.  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  ROS: Constitutional: see HPI Eyes: no blurry vision, no xerophthalmia ENT: no sore throat, no nodules palpated in throat, no dysphagia/odynophagia, no hoarseness Cardiovascular: no CP/SOB/palpitations/leg swelling Respiratory: +  all: cough/SOB/wheezing Gastrointestinal: + all: N/V/D/C/heartburn  Musculoskeletal: + muscle/joint aches Skin: no rashes, + itching, + rash, + easy bruising Neurological: + finr tremors/numbness/tingling/dizziness, + HA Psychiatric: no depression/anxiety + Low libido  Past Medical History  Diagnosis Date  . Disturbance of skin sensation 08/13/2013  . Myalgia and myositis, unspecified 08/13/2013  . Depression with anxiety   . GERD (gastroesophageal reflux disease)   . Hiatal hernia   . Malnutrition   . Migraine   . Renal calculus   . Dysphagia, idiopathic   . Abnormal weight loss   . GERD (gastroesophageal reflux disease)   . COPD (chronic obstructive pulmonary disease)   . Elevated antinuclear antibody (ANA) level   . Vitamin D deficiency   . Idiopathic peripheral neuropathy   . Cachexia   . Abdominal pain, chronic, generalized   . Constipation   . Vitamin B12 deficiency   . Right thyroid nodule   . Lymphadenopathy, inguinal   . Spleen enlarged   . Abdominal pain, RUQ   . Nephrocalcinosis   . Microscopic hematuria   . Hyperlipidemia    Past Surgical History  Procedure Laterality Date  . Cholecystectomy  1978  . Abdominal hysterectomy  1998  . Tubal ligation Bilateral 1988  . Dilation and curettage of uterus  1998  . Nasal septum surgery  1984   History   Social History  . Marital Status: Married    Spouse Name: N/A    Number of Children: 2  . Years of Education: COLLEGE-2  Occupational History  . RETIRED    Social History Main Topics  . Smoking status: Never Smoker   . Smokeless tobacco: Never Used  . Alcohol Use: No     Comment: FORMER WINE OCCASIONALLY/ QUIT 1997  . Drug Use: No   Current Outpatient Prescriptions on File Prior to Visit  Medication Sig Dispense Refill  . Cholecalciferol (VITAMIN D3) LIQD 4,000 Units by Does not apply route daily.       Marland Kitchen. estradiol (CLIMARA - DOSED IN MG/24 HR) 0.025 mg/24hr patch Place 1 patch (0.025 mg total) onto  the skin once a week.  4 patch  11   No current facility-administered medications on file prior to visit.   Allergies  Allergen Reactions  . Prednisone   . Albuterol   . Benadryl [Diphenhydramine Hcl]   . Doxycycline   . Ducodyl [Bisacodyl]   . Erythromycin   . Levaquin [Levofloxacin In D5w]    Family History  Problem Relation Age of Onset  . Heart Problems Father   . Hyperlipidemia Father   . Heart disease Father   . Hypertension Father   . Multiple sclerosis Sister   . Heart Problems Brother   . Hypertension Brother   . Heart Problems Brother   . Hypertension Brother   . Hypertension Brother   . Hypertension Brother   . Hypertension Brother   . Cancer Maternal Aunt     Colon   PE: BP 106/64  Pulse 100  Temp(Src) 97.3 F (36.3 C) (Oral)  Resp 12  Ht 5\' 6"  (1.676 m)  Wt 98 lb 14.4 oz (44.861 kg)  BMI 15.97 kg/m2  SpO2 98% Wt Readings from Last 3 Encounters:  01/30/14 98 lb 14.4 oz (44.861 kg)  01/16/14 99 lb 1.9 oz (44.961 kg)  01/10/14 101 lb (45.813 kg)   Constitutional: underweight, in NAD Eyes: PERRLA, EOMI, no exophthalmos ENT: moist mucous membranes, no thyromegaly, no thyroid nodule palpated; no cervical lymphadenopathy Cardiovascular: RRR, No MRG Respiratory: CTA B Gastrointestinal: abdomen soft, NT, ND, BS+ Musculoskeletal: no deformities, strength intact in all 4;  Skin: moist, warm, no rashes Neurological: no tremor with outstretched hands, DTR normal in all 4  ASSESSMENT: 1. ? Thyroid disorder - thyroid U/S 2013: no thyroid nodule  2. Dysesthesia  PLAN: 1. And 2. Patient with a history of mild increase in blood flow in the thyroid cardiopath on in 2013, however without any thyroid nodules. She developed dysesthesia in her cervical and face region, and she wonders whether this can be caused by the thyroid. She also has similar symptoms in the lower part of her body, with decreased sensation in the regions of her legs and arms. She has problems  swallowing, which are long-standing and which have been investigated EGD >> she has a history of GERD and hiatal hernia. She also has had weight loss over the years. - We discussed about the fact that the changes in sensation in her neck and face area are very unlikely to be caused by a thyroid issue, however would check thyroid function tests today. - Since she had an ultrasound in the past without the thyroid nodule and I cannot feel any thyroid nodules today, I would not recommend another ultrasound as of now. She agrees with this plan. - we discussed that maybe would be a good idea to see a toxicologist since she has questions about her amalgam fillings being the cause for her changes in sensation. - We'll schedule another appointment if her thyroid  tests are abnormal.  Office Visit on 01/30/2014  Component Date Value Ref Range Status  . TSH 01/30/2014 0.96  0.35 - 4.50 uIU/mL Final  . Free T4 01/30/2014 0.92  0.60 - 1.60 ng/dL Final  . T3, Free 96/11/5407 2.7  2.3 - 4.2 pg/mL Final   TFTs normal.

## 2014-02-03 ENCOUNTER — Other Ambulatory Visit: Payer: Self-pay | Admitting: Medical

## 2014-02-05 ENCOUNTER — Ambulatory Visit (INDEPENDENT_AMBULATORY_CARE_PROVIDER_SITE_OTHER): Payer: Medicare HMO | Admitting: General Surgery

## 2014-02-05 ENCOUNTER — Encounter (INDEPENDENT_AMBULATORY_CARE_PROVIDER_SITE_OTHER): Payer: Self-pay | Admitting: General Surgery

## 2014-02-05 VITALS — BP 116/78 | HR 96 | Temp 98.9°F | Resp 14 | Ht 66.0 in | Wt 98.6 lb

## 2014-02-05 DIAGNOSIS — K6289 Other specified diseases of anus and rectum: Secondary | ICD-10-CM

## 2014-02-05 NOTE — Patient Instructions (Signed)
GETTING TO GOOD BOWEL HEALTH. Irregular bowel habits such as diarrhea and multiple loose stools throughout the day can lead to many problems over time.  Having one soft, formed bowel movement a day is the most important way to prevent further problems.  The anorectal canal is designed to handle stretching and feces to safely manage our ability to get rid of solid waste (feces, poop, stool) out of our body.  BUT, diarrhea can be a burning fire to this very sensitive area of our body, causing inflamed hemorrhoids, anal fissures, increasing risk is perirectal abscesses, abdominal pain and bloating.      The goal: ONE SOFT BOWEL MOVEMENT A DAY!  To have soft, regular bowel movements:    Drink at least 8 tall glasses of water a day.     Take plenty of fiber.  Fiber is marketed as a cure for constipation, but in certain forms, it can help with loose stools as well.  Fiber is the undigested part of plant food that passes into the colon, acting as "natures broom" to encourage bowel motility and movement.  Fiber can absorb and hold large amounts of water. This results in a larger, bulkier stool, which is soft and easier to pass.    Work gradually over several weeks up to 6 servings a day of fiber (25g a day even more if needed) in the form of: o Vegetables -- Root (potatoes, carrots, turnips), leafy green (lettuce, salad greens, celery, spinach), or cooked high residue (cabbage, broccoli, etc) o Fruit -- Fresh (unpeeled skin & pulp), Dried (prunes, apricots, cherries, etc ),  or stewed ( applesauce)  o Whole grain breads, pasta, etc (whole wheat)  o Bran cereals    Bulking Agents -- This type of water-retaining fiber generally is easily tolerated and can help be a consistent source of fiber for irregular bowel habits.  Fibers that are recommended for this are:  o Methylcellulose --This is a fiber derived from wood which also retains water. It is available as Citrucel.  o FiberCon (Polycarbiphil) is another good  source of fiber for this that can help bulk your stools.    Controlling diarrhea o Switch to liquids and simpler foods for a few days to avoid stressing your intestines further. o Avoid dairy products (especially milk & ice cream) for a short time.  The intestines often can lose the ability to digest lactose when stressed. o Avoid foods that cause gassiness or bloating.  Typical foods include beans and other legumes, cabbage, broccoli, and dairy foods.  Every person has some sensitivity to other foods, so listen to our body and avoid those foods that trigger problems for you. o Adding fiber (Citrucel, Metamucil, psyllium, Miralax) gradually can help thicken stools by absorbing excess fluid and retrain the intestines to act more normally.  Slowly increase the dose over a few weeks.  Too much fiber too soon can backfire and cause cramping & bloating. o Probiotics (such as active yogurt, Align, etc) may help repopulate the intestines and colon with normal bacteria and calm down a sensitive digestive tract.  Most studies show it to be of mild help, though, and such products can be costly. o Medicines:   Bismuth subsalicylate (ex. Kayopectate, Pepto Bismol) every 30 minutes for up to 6 doses can help control diarrhea.  Avoid if pregnant.   Loperamide (Immodium) can slow down diarrhea.  Start with two tablets (4mg total) first and then try one tablet every 6 hours.  Avoid if you   are having fevers or severe pain.  If you are not better or start feeling worse, stop all medicines and call your doctor for advice o Call your doctor if you are getting worse or not better.  Sometimes further testing (cultures, endoscopy, X-ray studies, bloodwork, etc) may be needed to help diagnose and treat the cause of the diarrhea.

## 2014-02-05 NOTE — Progress Notes (Signed)
Chief Complaint  Patient presents with  . Rectal Problems    new pt eval rectocele    HISTORY: Catherine Gregory is a 68 y.o. female who presents to the office with difficulty with evacuation.  She has to change position to defecate.  Other symptoms include pain with sitting.  This had been occurring for the past few months.  she has tried a high fiber diet and prep H in the past with some success.  Sitting makes the symptoms (pain) worse.   It is intermittent in nature.  her bowel habits are regular and her bowel movements are usually soft.  her fiber intake is dietary.  her last colonoscopy was about 2-3 yrs ago and was neg.  She is s/p banding ~12 yrs ago.  She has been told she has a mild rectocele.  She is being evaluated for MS.  She has loss of sensation in her R buttock and perirectal area.  Past Medical History  Diagnosis Date  . Disturbance of skin sensation 08/13/2013  . Myalgia and myositis, unspecified 08/13/2013  . Depression with anxiety   . GERD (gastroesophageal reflux disease)   . Hiatal hernia   . Malnutrition   . Migraine   . Renal calculus   . Dysphagia, idiopathic   . Abnormal weight loss   . GERD (gastroesophageal reflux disease)   . COPD (chronic obstructive pulmonary disease)   . Elevated antinuclear antibody (ANA) level   . Vitamin D deficiency   . Idiopathic peripheral neuropathy   . Cachexia   . Abdominal pain, chronic, generalized   . Constipation   . Vitamin B12 deficiency   . Right thyroid nodule   . Lymphadenopathy, inguinal   . Spleen enlarged   . Abdominal pain, RUQ   . Nephrocalcinosis   . Microscopic hematuria   . Hyperlipidemia       Past Surgical History  Procedure Laterality Date  . Cholecystectomy  1978  . Abdominal hysterectomy  1998  . Tubal ligation Bilateral 1988  . Dilation and curettage of uterus  1998  . Nasal septum surgery  1984        Current Outpatient Prescriptions  Medication Sig Dispense Refill  . Cholecalciferol  (VITAMIN D3) LIQD 4,000 Units by Does not apply route daily.       Marland Kitchen. estradiol (CLIMARA - DOSED IN MG/24 HR) 0.025 mg/24hr patch Place 1 patch (0.025 mg total) onto the skin once a week.  4 patch  11   No current facility-administered medications for this visit.      Allergies  Allergen Reactions  . Prednisone   . Contrast Media [Iodinated Diagnostic Agents]   . Fentanyl And Related Other (See Comments)    Blood pressure drops  . Other     Fluorescien- body aches  . Albuterol   . Doxycycline   . Ducodyl [Bisacodyl]   . Erythromycin   . Levaquin [Levofloxacin In D5w]       Family History  Problem Relation Age of Onset  . Heart Problems Father   . Hyperlipidemia Father   . Heart disease Father   . Hypertension Father   . Multiple sclerosis Sister   . Heart Problems Brother   . Hypertension Brother   . Heart Problems Brother   . Hypertension Brother   . Hypertension Brother   . Hypertension Brother   . Hypertension Brother   . Cancer Maternal Aunt     Colon    History   Social History  .  Marital Status: Single    Spouse Name: N/A    Number of Children: 2  . Years of Education: COLLEGE-2   Occupational History  . RETIRED    Social History Main Topics  . Smoking status: Never Smoker   . Smokeless tobacco: Never Used  . Alcohol Use: No     Comment: FORMER WINE OCCASIONALLY/ QUIT 1997  . Drug Use: No  . Sexual Activity: Not Currently   Other Topics Concern  . None   Social History Narrative  . None      REVIEW OF SYSTEMS - PERTINENT POSITIVES ONLY: Review of Systems - General ROS: negative for - chills, fever or weight loss Hematological and Lymphatic ROS: negative for - bleeding problems, blood clots or bruising Respiratory ROS: no cough, shortness of breath, or wheezing Cardiovascular ROS: no chest pain or dyspnea on exertion Gastrointestinal ROS: positive for - abdominal pain and nausea/vomiting negative for - change in bowel habits, change in  stools, constipation or diarrhea Genito-Urinary ROS: no dysuria, trouble voiding, or hematuria  EXAM: Filed Vitals:   02/05/14 1501  BP: 116/78  Pulse: 96  Temp: 98.9 F (37.2 C)  Resp: 14    General appearance: alert and cooperative Resp: clear to auscultation bilaterally Cardio: regular rate and rhythm GI: soft, non-tender; bowel sounds normal; no masses,  no organomegaly  Procedure: Anoscopy Surgeon: Maisie Fus Diagnosis: pain with defecation  Assistant: Felix Ahmadi After the risks and benefits were explained, verbal consent was obtained for above procedure  Anesthesia: none Findings: anal canal inflammation and mild anal irritation, small rectocele, moderate sphincter htn    ASSESSMENT AND PLAN: Catherine Gregory is a 68 y.o. female Who presents to the office with rectal pain and difficulty with defecation. On exam she has a mild rectocele and some mild anal irritation. She also has some sphincter hypertension.  Her defecation issues get better when she is eating a diet high in fruits.  I think she is responding to the fiber. I recommended that she continue a high-fiber diet or use a fiber supplement to help with her defecation issues. I think this will also help her anal pain. I do not see any anatomical defect for her issues. I will see her back in the office as needed.    Vanita Panda, MD Colon and Rectal Surgery / General Surgery [facility]      Visit Diagnoses: 1. Anal pain     Primary Care Physician: Sanda Linger, MD

## 2014-02-10 ENCOUNTER — Ambulatory Visit (INDEPENDENT_AMBULATORY_CARE_PROVIDER_SITE_OTHER): Payer: Medicare HMO | Admitting: General Surgery

## 2014-02-12 ENCOUNTER — Encounter: Payer: Self-pay | Admitting: Obstetrics & Gynecology

## 2014-02-12 ENCOUNTER — Ambulatory Visit (INDEPENDENT_AMBULATORY_CARE_PROVIDER_SITE_OTHER): Payer: Medicare HMO | Admitting: Obstetrics & Gynecology

## 2014-02-12 VITALS — BP 107/64 | HR 92 | Temp 97.8°F | Ht 66.0 in | Wt 98.8 lb

## 2014-02-12 DIAGNOSIS — Z538 Procedure and treatment not carried out for other reasons: Secondary | ICD-10-CM

## 2014-02-12 DIAGNOSIS — R232 Flushing: Secondary | ICD-10-CM

## 2014-02-12 NOTE — Patient Instructions (Signed)
Menopause Menopause is the normal time of life when menstrual periods stop completely. Menopause is complete when you have missed 12 consecutive menstrual periods. It usually occurs between the ages of 48 years and 55 years. Very rarely does a woman develop menopause before the age of 40 years. At menopause, your ovaries stop producing the female hormones estrogen and progesterone. This can cause undesirable symptoms and also affect your health. Sometimes the symptoms may occur 4-5 years before the menopause begins. There is no relationship between menopause and:  Oral contraceptives.  Number of children you had.  Race.  The age your menstrual periods started (menarche). Heavy smokers and very thin women may develop menopause earlier in life. CAUSES  The ovaries stop producing the female hormones estrogen and progesterone.  Other causes include:  Surgery to remove both ovaries.  The ovaries stop functioning for no known reason.  Tumors of the pituitary gland in the brain.  Medical disease that affects the ovaries and hormone production.  Radiation treatment to the abdomen or pelvis.  Chemotherapy that affects the ovaries. SYMPTOMS   Hot flashes.  Night sweats.  Decrease in sex drive.  Vaginal dryness and thinning of the vagina causing painful intercourse.  Dryness of the skin and developing wrinkles.  Headaches.  Tiredness.  Irritability.  Memory problems.  Weight gain.  Bladder infections.  Hair growth of the face and chest.  Infertility. More serious symptoms include:  Loss of bone (osteoporosis) causing breaks (fractures).  Depression.  Hardening and narrowing of the arteries (atherosclerosis) causing heart attacks and strokes. DIAGNOSIS   When the menstrual periods have stopped for 12 straight months.  Physical exam.  Hormone studies of the blood. TREATMENT  There are many treatment choices and nearly as many questions about them. The  decisions to treat or not to treat menopausal changes is an individual choice made with your health care provider. Your health care provider can discuss the treatments with you. Together, you can decide which treatment will work best for you. Your treatment choices may include:   Hormone therapy (estrogen and progesterone).  Non-hormonal medicines.  Treating the individual symptoms with medicine (for example antidepressants for depression).  Herbal medicines that may help specific symptoms.  Counseling by a psychiatrist or psychologist.  Group therapy.  Lifestyle changes including:  Eating healthy.  Regular exercise.  Limiting caffeine and alcohol.  Stress management and meditation.  No treatment. HOME CARE INSTRUCTIONS   Take the medicine your health care provider gives you as directed.  Get plenty of sleep and rest.  Exercise regularly.  Eat a diet that contains calcium (good for the bones) and soy products (acts like estrogen hormone).  Avoid alcoholic beverages.  Do not smoke.  If you have hot flashes, dress in layers.  Take supplements, calcium, and vitamin D to strengthen bones.  You can use over-the-counter lubricants or moisturizers for vaginal dryness.  Group therapy is sometimes very helpful.  Acupuncture may be helpful in some cases. SEEK MEDICAL CARE IF:   You are not sure you are in menopause.  You are having menopausal symptoms and need advice and treatment.  You are still having menstrual periods after age 55 years.  You have pain with intercourse.  Menopause is complete (no menstrual period for 12 months) and you develop vaginal bleeding.  You need a referral to a specialist (gynecologist, psychiatrist, or psychologist) for treatment. SEEK IMMEDIATE MEDICAL CARE IF:   You have severe depression.  You have excessive vaginal bleeding.    You fell and think you have a broken bone.  You have pain when you urinate.  You develop leg or  chest pain.  You have a fast pounding heart beat (palpitations).  You have severe headaches.  You develop vision problems.  You feel a lump in your breast.  You have abdominal pain or severe indigestion. Document Released: 11/05/2003 Document Revised: 04/17/2013 Document Reviewed: 03/14/2013 ExitCare Patient Information 2015 [employer]. This information is not intended to replace advice given to you by your health care provider. Make sure you discuss any questions you have with your health care provider.  

## 2014-02-12 NOTE — Progress Notes (Signed)
Subjective:     [name], female   DOB: 1946/08/19, 68 y.o.   MRN: [id number]  HPI Pt presents for repeat PAP.  She is s/p cryo 4 years prev and has intermittently been call back for inadequate PAPs since that time.  She reports hot flushes that she has seen multiple physicians about. Her endocrinologist recommended increasing her EES but, at the time she did not want to.  She is currently undergoing an eval to r/o MS.    Review of Systems     Objective:   Physical Exam BP 107/64  Pulse 92  Temp(Src) 97.8 F (36.6 C) (Oral)  Ht 5\' 6"  (1.676 m)  Wt 98 lb 12.8 oz (44.815 kg)  BMI 15.95 kg/m2 Pt in NAD GU: EGBUS: no lesions Vagina: no blood in vault Cervix: no lesion; no mucopurulent d/c ;os stenotic      11/22/2013 Adequacy Reason Satisfactory for evaluation, endocervical/transformation zone component ABSENT. Diagnosis NEGATIVE FOR INTRAEPITHELIAL LESIONS OR MALIGNANCY. GARDEN[facility] BARNES Health visitor (Case signed 11/25/2013) Source CervicoVaginal Pap [ThinPrep Imaged] Ancillary Testing HPV High Risk High Risk HPV: NOT DETECTED Assessment:     No endocervical cells on PAP with negative HPV Vasomotor sx     Plan:     F/u HPV/PAP in 5 years F/u 1 year for annual  Pt considering increasing EES for vasomotor sx.  She is scheduled to see her Neurologist tomorrow and if that exam is normal she will call to get 3 months refill of and increased dose of EES

## 2014-02-13 ENCOUNTER — Ambulatory Visit (INDEPENDENT_AMBULATORY_CARE_PROVIDER_SITE_OTHER): Payer: Medicare HMO | Admitting: Neurology

## 2014-02-13 ENCOUNTER — Other Ambulatory Visit: Payer: Self-pay | Admitting: Obstetrics & Gynecology

## 2014-02-13 ENCOUNTER — Telehealth: Payer: Self-pay | Admitting: *Deleted

## 2014-02-13 ENCOUNTER — Encounter: Payer: Self-pay | Admitting: Neurology

## 2014-02-13 VITALS — BP 104/60 | HR 96 | Resp 14 | Ht 66.0 in | Wt 99.0 lb

## 2014-02-13 DIAGNOSIS — R232 Flushing: Secondary | ICD-10-CM

## 2014-02-13 DIAGNOSIS — R209 Unspecified disturbances of skin sensation: Secondary | ICD-10-CM

## 2014-02-13 DIAGNOSIS — G609 Hereditary and idiopathic neuropathy, unspecified: Secondary | ICD-10-CM

## 2014-02-13 MED ORDER — ESTRADIOL 0.05 MG/24HR TD PTWK: MEDICATED_PATCH | TRANSDERMAL | Status: DC

## 2014-02-13 NOTE — Telephone Encounter (Signed)
Pt called nurse line and states that Dr. Erin Fulling is awaiting information from Dr. Allena Katz to see about current prescription.  Pt ask that we call her.    Called patient and confirmed that she seen Dr. Allena Katz and she desires to go ahead with Dr. Danne Harbor plan.  Informed patient that I would talk with Dr. Erin Fulling and call her back before the end of the day.   Information given to Dr. Erin Fulling and she will handle prescription;  Patient informed.

## 2014-02-13 NOTE — Progress Notes (Signed)
[facility] Clinic Note - Initial Visit   Date: 02/13/2014  Catherine Gregory MRN: [id number] DOB: 15-Dec-1945   Dear Dr Yetta Barre:  Thank you for your kind referral of Catherine Gregory for consultation of abnormal skin sensation. Although her history is well known to you, please allow [place] to reiterate it for the purpose of our medical record. The patient was accompanied to the clinic by self.     History of Present Illness: Catherine Gregory is a 68 y.o. right-handed Caucasian female with history of GERD, COPD, idiopathic peripheral neuropathy, fibromyaglia, IBS, and anxiety presenting for evaluation of numbness and tingling of the whole body.    Starting in 1998, she developed numbness/tingling of the hands and feet and was diagnosed with idiopathic peripheral neuropathy.  She has EMG of the left side which confirmed neuropathy (no reports to review).  Symptoms had been relatively stable until about 68-months ago, when her paresthesias started to involve the neck, head, and face. She feels that there are numb places over her neck, tingling of her ears, sensation of eyelids pulling down, and lips feel swollen.  She has previously seen neurologists in [place], [place], [place], and most recently Dr. Anne Hahn at [facility].  He ordered MRI brain which showed mild white matter changes, but unchanged from previous MRI brain.  Due to possible concern of multiple sclerosis, CSF testing was discussed, but patient declined tested as she is unable to lay flat due to chronic cough.  Therefore, it was decided to have follow-up MRI brain imaging to look for progression of disease, but she has not had this yet.  She complains of problems with balance, but denies any falls, and ambulates independently.  She has many other chronic somatic complaints including, feeling as if her face is distorted, sporadic brief sharp pain occuring throughout her, skin changes, digestive problems, chronic  cough, breathing problems, and migraines.  She has since seen opthalmologist and endocrinologist with negative evaluation.    Of note, she recently moved from [place], [place] where she was living with her daughter and now residing with her brother and sister-in-law.  She endorses a significant amount of stress lately, especially with her recent move and in planning on moving back in with her daughter after she relocates to [place].  Per her last clinic note at [facility], "The patient has had nerve conduction studies that show a mild peripheral neuropathy, and a mild left ulnar neuropathy. The patient has a history of a positive ANA associated with joint pain. MRI of the cervical spine as been done in December 2007, and did not show evidence of spinal cord lesions. Degenerative changes were seen at the C4-5 and C5-6 levels with disc bulges as well. MRI of the lumbosacral spine was unremarkable. The patient does have nonspecific white matter changes on a recent MRI the brain. Her recent MRI the cervical spine did not show any cord lesions. Mild spondylosis."   Out-side paper records, electronic medical record, and images have been reviewed where available and summarized as:  Component     Latest Ref Rng 08/13/2013  Angio Convert Enzyme     14 - 82 U/L 51  Lyme IgG/IgM Ab     0.00 - 0.90 ISR <0.91  Thiamine     66.5 - 200.0 nmol/L 142.6  Copper     72 - 166 ug/dL 741  ANA     Negative Negative   Component     Latest Ref Rng 01/30/2014  TSH     0.35 - 4.50 uIU/mL 0.96  Free T4     0.60 - 1.60 ng/dL 5.36  T3, Free     2.3 - 4.2 pg/mL 2.7    Past Medical History  Diagnosis Date  . Disturbance of skin sensation 08/13/2013  . Myalgia and myositis, unspecified 08/13/2013  . Depression with anxiety   . GERD (gastroesophageal reflux disease)   . Hiatal hernia   . Malnutrition   . Migraine   . Renal calculus   . Dysphagia, idiopathic   . Abnormal weight loss   . GERD (gastroesophageal reflux  disease)   . COPD (chronic obstructive pulmonary disease)   . Elevated antinuclear antibody (ANA) level   . Vitamin D deficiency   . Idiopathic peripheral neuropathy   . Cachexia   . Abdominal pain, chronic, generalized   . Constipation   . Vitamin B12 deficiency   . Right thyroid nodule   . Lymphadenopathy, inguinal   . Spleen enlarged   . Abdominal pain, RUQ   . Nephrocalcinosis   . Microscopic hematuria   . Hyperlipidemia     Past Surgical History  Procedure Laterality Date  . Cholecystectomy  1978  . Abdominal hysterectomy  1998  . Tubal ligation Bilateral 1988  . Dilation and curettage of uterus  1998  . Nasal septum surgery  1984     Medications:  Current Outpatient Prescriptions on File Prior to Visit  Medication Sig Dispense Refill  . Cholecalciferol (VITAMIN D3) LIQD 4,000 Units by Does not apply route daily.       Marland Kitchen estradiol (CLIMARA - DOSED IN MG/24 HR) 0.025 mg/24hr patch PLACE 1 PATCH ONTO THE SKIN EVERY 7 DAYS  12 patch  1   No current facility-administered medications on file prior to visit.    Allergies:  Allergies  Allergen Reactions  . Prednisone   . Contrast Media [Iodinated Diagnostic Agents]   . Fentanyl And Related Other (See Comments)    Blood pressure drops  . Other     Fluorescien- body aches  . Albuterol   . Doxycycline   . Ducodyl [Bisacodyl]   . Erythromycin   . Levaquin [Levofloxacin In D5w]     Family History: Family History  Problem Relation Age of Onset  . Heart Problems Father   . Hyperlipidemia Father   . Heart disease Father   . Hypertension Father   . Multiple sclerosis Sister   . Heart Problems Brother   . Hypertension Brother   . Heart Problems Brother   . Hypertension Brother   . Hypertension Brother   . Hypertension Brother   . Hypertension Brother   . Cancer Maternal Aunt     Colon    Social History: History   Social History  . Marital Status: Single    Spouse Name: N/A    Number of Children: 2  .  Years of Education: COLLEGE-2   Occupational History  . RETIRED    Social History Main Topics  . Smoking status: Never Smoker   . Smokeless tobacco: Never Used  . Alcohol Use: No     Comment: FORMER WINE OCCASIONALLY/ QUIT 1997  . Drug Use: No  . Sexual Activity: Not Currently   Other Topics Concern  . Not on file   Social History Narrative  . No narrative on file    Review of Systems:  CONSTITUTIONAL: No fevers, chills, night sweats, or weight loss.   EYES: +visual changes or eye pain ENT:  No hearing changes.  No history of nose bleeds.   RESPIRATORY: No cough, wheezing +shortness of breath.   CARDIOVASCULAR: Negative for chest pain, and palpitations.   GI: +for abdominal discomfort, blood in stools or black stools.  No recent change in bowel habits.   GU:  No history of incontinence.   MUSCLOSKELETAL: +history of joint pain or swelling.  +myalgias.   SKIN: +for lesions, rash, and itching.   HEMATOLOGY/ONCOLOGY: Negative for prolonged bleeding, bruising easily, and swollen nodes.  No history of cancer.   ENDOCRINE: Negative for cold or heat intolerance, polydipsia or goiter.   PSYCH:  ++depression or anxiety symptoms.   NEURO: As Above.   Vital Signs:  BP 104/60  Pulse 96  Resp 14  Ht 5\' 6"  (1.676 m)  Wt 99 lb (44.906 kg)  BMI 15.99 kg/m2   General Medical Exam:   General:  Very thin-appearing, anxious, comfortable.   Eyes/ENT: see cranial nerve examination.   Neck: No masses appreciated.  Full range of motion without tenderness.  No carotid bruits. Respiratory:  Clear to auscultation, good air entry bilaterally.   Cardiac:  Regular rate and rhythm, no murmur.   Back:  No pain to palpation of spinous processes.   Extremities:  No deformities, edema, or skin discoloration. Good capillary refill.   Skin:  Skin color, texture, turgor normal. No rashes or lesions.  Neurological Exam: MENTAL STATUS including orientation to time, place, person, recent and remote  memory, attention span and concentration, language, and fund of knowledge is normal.  Speech is not dysarthric.  CRANIAL NERVES: II:  No visual field defects.  Unremarkable fundi.   III-IV-VI: Pupils equal round and reactive to light.  Normal conjugate, extra-ocular eye movements in all directions of gaze.  No nystagmus.  No ptosis.   V:  Normal facial sensation.   VII:  Normal facial symmetry and movements.   VIII:  Normal hearing and vestibular function.   IX-X:  Normal palatal movement.   XI:  Normal shoulder shrug and head rotation.   XII:  Normal tongue strength and range of motion, no deviation or fasciculation.  MOTOR:  No atrophy, fasciculations or abnormal movements.  No pronator drift.  Tone is normal.    Right Upper Extremity:    Left Upper Extremity:    Deltoid  5/5   Deltoid  5/5   Biceps  5/5   Biceps  5/5   Triceps  5/5   Triceps  5/5   Wrist extensors  5/5   Wrist extensors  5/5   Wrist flexors  5/5   Wrist flexors  5/5   Finger extensors  5/5   Finger extensors  5/5   Finger flexors  5/5   Finger flexors  5/5   Dorsal interossei  5/5   Dorsal interossei  5/5   Abductor pollicis  5/5   Abductor pollicis  5/5   Tone (Ashworth scale)  0  Tone (Ashworth scale)  0   Right Lower Extremity:    Left Lower Extremity:    Hip flexors  5/5   Hip flexors  5/5   Hip extensors  5/5   Hip extensors  5/5   Knee flexors  5/5   Knee flexors  5/5   Knee extensors  5/5   Knee extensors  5/5   Dorsiflexors  5/5   Dorsiflexors  5/5   Plantarflexors  5/5   Plantarflexors  5/5   Toe extensors  5/5   Toe extensors  5/5   Toe flexors  5/5   Toe flexors  5/5   Tone (Ashworth scale)  0  Tone (Ashworth scale)  0   MSRs:  Right                                                                 Left brachioradialis 2+  brachioradialis 2+  biceps 2+  biceps 2+  triceps 2+  triceps 2+  patellar 2+  patellar 2+  ankle jerk 1+  ankle jerk 1+  Hoffman no  Hoffman no  plantar response down   plantar response down   SENSORY:  Normal and symmetric perception of light touch, pinprick, vibration, and proprioception.  Romberg's sign absent.   COORDINATION/GAIT: Normal finger-to- nose-finger and heel-to-shin.  Intact rapid alternating movements bilaterally.  Able to rise from a chair without using arms.  Gait narrow based and stable. Tandem and stressed gait intact.    IMPRESSION: Ms. Delahunty is a 68 year-old female presenting for evaluation of generalized whole body paresthesias.  Her neurological is non-focal and although she may have signs of early distal neuropathy affecting the feet, I do not feel that her face and upper extremity symptoms are due to idiopathic peripheral neuropathy. I have also reviewed her MRI brain and there is evidence of scattered white matter changes involving the subcortical region.  In the past, the question of multiple sclerosis has been raised and I explained that to determine if, indeed, her MRI changes are due to demyelinating disease, lumbar puncture is the next step, but patient is very relunctant to have it performed due to inability to lay flat following the procedure.  My clinical suspicion for multiple sclerosis is low based on her history and lack of upper motor neuron findings.  I feel that some of her symptoms may be due to overlaying stress/anxiety especially since she endorses worsening of symptoms surrounding her recent move which she surprisingly seems to agree with.    For completeness, I will check a few additional labs for neuropathy. If there is no improvement of symptoms by her next visit, will plan on repeat MRI brain wwo contrast.     PLAN/RECOMMENDATIONS:  1.  Check vitamin B12, vitamin B6, zinc, SPEP/UPEP with IFE, 2hr-glucose tolerance test, vitamin D25 2.  Discussed referral to behavioral counsellor for coping mechanisms, but she declined and stated that she has seen them in the past 3.  Return to clinic in 28-months    The duration of  this appointment visit was 45 minutes of face-to-face time with the patient.  Greater than 50% of this time was spent in counseling, explanation of diagnosis, planning of further management, and coordination of care.   Thank you for allowing me to participate in patient's care.  If I can answer any additional questions, I would be pleased to do so.    Sincerely,    Donika K. Allena Katz, DO

## 2014-02-17 LAB — UIFE/LIGHT CHAINS/TP QN, 24-HR UR
Albumin, U: DETECTED
FREE KAPPA LT CHAINS, UR: 0.4 mg/dL (ref 0.14–2.42)
Free Kappa/Lambda Ratio: 8 ratio (ref 2.04–10.37)
Free Lambda Lt Chains,Ur: 0.05 mg/dL (ref 0.02–0.67)
Total Protein, Urine: 1.1 mg/dL

## 2014-02-20 ENCOUNTER — Other Ambulatory Visit: Payer: Self-pay | Admitting: Neurology

## 2014-02-20 LAB — VITAMIN B12: Vitamin B-12: 325 pg/mL (ref 211–911)

## 2014-02-21 LAB — VITAMIN D 25 HYDROXY (VIT D DEFICIENCY, FRACTURES): VIT D 25 HYDROXY: 69 ng/mL (ref 30–89)

## 2014-02-22 LAB — ZINC: ZINC: 78 ug/dL (ref 60–130)

## 2014-02-24 LAB — VITAMIN B6: VITAMIN B6: 5.9 ng/mL (ref 2.1–21.7)

## 2014-04-24 ENCOUNTER — Telehealth: Payer: Self-pay | Admitting: Neurology

## 2014-04-24 NOTE — Telephone Encounter (Signed)
Pt did not want to r/s her f/u appt on 05/16/14. Dr. Allena Katz is not going to be in the office that day.

## 2014-05-16 ENCOUNTER — Ambulatory Visit: Payer: Medicare HMO | Admitting: Neurology

## 2014-06-30 ENCOUNTER — Encounter: Payer: Self-pay | Admitting: Neurology

## 2014-07-14 ENCOUNTER — Ambulatory Visit (INDEPENDENT_AMBULATORY_CARE_PROVIDER_SITE_OTHER): Payer: Medicare HMO | Admitting: Internal Medicine

## 2014-07-14 ENCOUNTER — Ambulatory Visit (INDEPENDENT_AMBULATORY_CARE_PROVIDER_SITE_OTHER)
Admission: RE | Admit: 2014-07-14 | Discharge: 2014-07-14 | Disposition: A | Discharge status: Home / Self Care | Payer: Medicare HMO | Source: Ambulatory Visit | Attending: Internal Medicine | Admitting: Internal Medicine

## 2014-07-14 ENCOUNTER — Encounter: Payer: Self-pay | Admitting: Internal Medicine

## 2014-07-14 ENCOUNTER — Telehealth: Payer: Self-pay | Admitting: *Deleted

## 2014-07-14 ENCOUNTER — Telehealth: Payer: Self-pay | Admitting: Internal Medicine

## 2014-07-14 VITALS — BP 110/60 | HR 80 | Temp 97.8°F | Resp 20 | Ht 66.0 in | Wt 96.8 lb

## 2014-07-14 VITALS — BP 110/70 | HR 101 | Temp 97.5°F | Resp 12 | Wt 96.2 lb

## 2014-07-14 DIAGNOSIS — J189 Pneumonia, unspecified organism: Secondary | ICD-10-CM

## 2014-07-14 DIAGNOSIS — E039 Hypothyroidism, unspecified: Secondary | ICD-10-CM

## 2014-07-14 DIAGNOSIS — E538 Deficiency of other specified B group vitamins: Secondary | ICD-10-CM

## 2014-07-14 DIAGNOSIS — J410 Simple chronic bronchitis: Secondary | ICD-10-CM

## 2014-07-14 MED ORDER — CYANOCOBALAMIN 2000 MCG PO TABS: 2000.0000 ug | ORAL_TABLET | Freq: Every day | ORAL | Status: DC

## 2014-07-14 MED ORDER — SYNTHROID 50 MCG PO TABS: 50.0000 ug | ORAL_TABLET | Freq: Every day | ORAL | Status: DC

## 2014-07-14 MED ORDER — LEVOTHYROXINE SODIUM 50 MCG PO TABS: 50.0000 ug | ORAL_TABLET | Freq: Every day | ORAL | Status: DC

## 2014-07-14 NOTE — Telephone Encounter (Signed)
New rx sent to pt's pharmacy

## 2014-07-14 NOTE — Progress Notes (Signed)
[name], female   DOB: July 03, 1946, 68 y.o.   MRN: [id number]   HPI  Catherine Gregory is a 68 y.o.-year-old female, whom I saw 5 mo ago for ?Thyroid dysfunction in the context of dysesthesia. At that time, TFTs were normal. Now returning for newly diagnosed hypothyroidism.  She returns after a hospitalization in [place], [place] - admitted 06/29/2014. She had flu-like sxs and then developed CP >> dx with PNA. Her TSH was found to be very high, at 17 >> started on LT4 50 mcg daily.  She started to take Synthroid: - in am - fasting >> but gets nausea and AP >> may have 1/2 saltine cracker - eats b'fast at least 30 min later - no Ca, iron, PPI, MVI  She mentions that she still does not feel well yet after her hospitalization.  Pt c/o: - + fatigue (but improved after hospitalization) - + heat intolerance/cold intolerance (see above) - + tremors - no palpitations - no anxiety/depression - + N/V/D/C/reflux - + weight loss (over time) - has a very healthy diet - no recently hair falling  Reviewed hx: She describes having neuropathy in legs and arms for years. She also had swallowing problems, hoarseness >> had thyroid investigation: U/S, TFTs. I reviewed her Thyroid U/S (12/15/2011): no thyroid nodules, possible acute vs chronic mild thyroiditis  I reviewed pt's thyroid tests from Tx and from here: 11.03.2015: TSH 17.06, fT4 1.24 (0.6-1.6), fT3 2.0  Lab Results  Component Value Date   TSH 0.96 01/30/2014   TSH 4.10 10/10/2013   FREET4 0.92 01/30/2014    She had testing for adrenal insufficiency in the past >> normal.   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  Her B12 was low, at 292, as per check at her last hospitalization in [place]. She did not start on vitamin B12 supplementation yet.  Patient has a history of aspiration PNA several times, has a hiatal hernia,  has GERD. She had 4-5 EGDs.   ROS: Constitutional: see HPI, had fever and chills recently Eyes: no blurry vision, no xerophthalmia ENT: no sore throat, no nodules palpated in throat, + dysphagia/no odynophagia, + hoarseness Cardiovascular: + CP/+ SOB/no palpitations/leg swelling Respiratory: + all: cough/SOB. No wheezing Gastrointestinal: + N/no V/D/C/heartburn  Musculoskeletal: no muscle/joint aches Skin: no rashes Neurological: + fine tremors/numbness/tingling/dizziness, + HA  I reviewed pt's medications, allergies, PMH, social hx, family hx and no changes required, except as mentioned above.  PE: BP 110/70 mmHg  Pulse 101  Temp(Src) 97.5 F (36.4 C) (Oral)  Resp 12  Wt 96 lb 3.2 oz (43.636 kg)  SpO2 98% Wt Readings from Last 3 Encounters:  07/14/14 96 lb 3.2 oz (43.636 kg)  02/13/14 99 lb (44.906 kg)  02/12/14 98 lb 12.8 oz (44.815 kg)   Constitutional: underweight, appears weak and tremulous, soft voice, otherwise in NAD Eyes: PERRLA, EOMI, no exophthalmos ENT: moist mucous membranes, no thyromegaly, no thyroid nodule palpated; no cervical lymphadenopathy Cardiovascular: tachycardia, RR, No MRG Respiratory: CTA B Gastrointestinal: abdomen soft, NT, ND, BS+ Musculoskeletal: no deformities, strength intact in all 4;  Skin: moist, warm, no rashes Neurological: no tremor with outstretched hands, DTR normal in all 4  ASSESSMENT: 1. Hypothyroidism - recently started on levothyroxine, but has nausea and abdominal pain when taking - thyroid U/S 2013: no thyroid nodule  2. B12 deficiency  PLAN: 1. Pt  with a new diagnosis of hypothyroidism, with several symptoms, mostly fatigue that has been going on for a long time but was exacerbated by her new pneumonia admission. She was started on levothyroxine, which she is taking every morning, however she tells me that she cannot tolerate this because she has abdominal pain and nausea. She tells me she dreads waking up in the morning  because she has to take the medication.  She does not take any other medicines.  - we discussed about correct intake of levothyroxine, every day, with water, >30 minutes before breakfast, separated by >4 hours from acid reflux medications, calcium, iron, multivitamins. She is taking it correctly now, but with intolerance, as discussed. I therefore suggested to start her on Synthroid d.a.w. 50 g daily, which is the tablet that's not colored and does not contain additives. I'm hoping that she can tolerate this dose until better. If she does not, then we will remove the levothyroxine at bedtime. I will try to avoid this, if possible, because this can introduce variability her LT4 absorption and subsequently in her thyroid levels. - I advised her to come back for TSH and free T4 draw in a month. I will add thyroid antibodies at that time. - I will see her back in 3 months and will repeat the labs then. Orders Placed This Encounter  Procedures  . T4, free  . TSH  . Thyroid Peroxidase Antibody   2. Patient with newly diagnosed B12 deficiency, not on treatment yet. I advised her to discuss with Dr. Yetta BarreJones about different modes of supplementation. She has an appointment with him this afternoon.

## 2014-07-14 NOTE — Patient Instructions (Signed)

## 2014-07-14 NOTE — Patient Instructions (Signed)
Please start brand name Synthroid 50 mcg daily in am. Take the thyroid hormone every day, with water, >30 minutes before breakfast, separated by >4 hours from acid reflux medications, calcium, iron, multivitamins. Please come back in 1 month for thyroid labs. Please come back for a follow-up appointment in 3 months. Please discuss with Dr Yetta Barre about B12 vitamin replacement.

## 2014-07-14 NOTE — Progress Notes (Signed)
Pre visit review using our clinic review tool, if applicable. No additional management support is needed unless otherwise documented below in the visit note. 

## 2014-07-14 NOTE — Telephone Encounter (Signed)
Patient states Dr. Elvera LennoxGherghe was going to change her rx from generic to regular rx    Please advise patient    Thank you

## 2014-07-14 NOTE — Telephone Encounter (Signed)
Left message on voicemail to call [facility] Dr. Yetta Barre nurse. Pt left documents here, put on Lakisha's desk.

## 2014-07-14 NOTE — Progress Notes (Signed)
   Subjective:    [name], female    DOB: 1945/11/29, 68 y.o.   MRN: [id number]  HPI  She returns for f/up after being admitted for PNA in [place], [place] 2 weeks ago. She tells me that she was also told that she has B12 defic. Her cough has resolved and she has no complaints today.    Review of Systems  Constitutional: Positive for fatigue. Negative for fever, chills, diaphoresis and appetite change.  HENT: Negative.   Eyes: Negative.   Respiratory: Negative.  Negative for cough, choking, chest tightness, shortness of breath and stridor.   Cardiovascular: Negative.  Negative for chest pain, palpitations and leg swelling.  Gastrointestinal: Negative.  Negative for nausea, vomiting, abdominal pain, diarrhea and constipation.  Endocrine: Negative.   Genitourinary: Negative.   Musculoskeletal: Negative.  Negative for myalgias, back pain and arthralgias.  Skin: Negative.  Negative for rash.  Allergic/Immunologic: Negative.   Neurological: Negative.   Hematological: Negative.  Negative for adenopathy. Does not bruise/bleed easily.  Psychiatric/Behavioral: Negative.        Objective:   Physical Exam  Constitutional: She is oriented to person, place, and time. She appears well-developed and well-nourished. No distress.  HENT:  Head: Normocephalic and atraumatic.  Mouth/Throat: Oropharynx is clear and moist. No oropharyngeal exudate.  Eyes: Conjunctivae are normal. Right eye exhibits no discharge. Left eye exhibits no discharge. No scleral icterus.  Neck: Normal range of motion. Neck supple. No JVD present. No tracheal deviation present. No thyromegaly present.  Cardiovascular: Normal rate, regular rhythm, normal heart sounds and intact distal pulses.  Exam reveals no gallop and no friction rub.   No murmur heard. Pulmonary/Chest: Effort normal and breath sounds normal. No stridor. No respiratory distress. She has no wheezes. She has no rales. She exhibits no tenderness.    Abdominal: Soft. Bowel sounds are normal. She exhibits no distension and no mass. There is no tenderness. There is no rebound and no guarding.  Musculoskeletal: Normal range of motion. She exhibits no edema or tenderness.  Lymphadenopathy:    She has no cervical adenopathy.  Neurological: She is oriented to person, place, and time.  Skin: Skin is warm and dry. No rash noted. She is not diaphoretic. No erythema. No pallor.  Psychiatric: She has a normal mood and affect. Her behavior is normal. Judgment and thought content normal.  Vitals reviewed.    Lab Results  Component Value Date   WBC 5.0 10/10/2013   HGB 14.5 10/10/2013   HCT 44.7 10/10/2013   PLT 220.0 10/10/2013   GLUCOSE 84 10/10/2013   CHOL 218* 10/10/2013   TRIG 44.0 10/10/2013   HDL 109.70 10/10/2013   LDLDIRECT 90.8 10/10/2013   ALT 20 10/10/2013   AST 23 10/10/2013   NA 138 10/10/2013   K 4.1 10/10/2013   CL 102 10/10/2013   CREATININE 0.5 10/10/2013   BUN 13 10/10/2013   CO2 28 10/10/2013   TSH 0.96 01/30/2014   INR 0.9 05/05/2009       Assessment & Plan:

## 2014-07-15 ENCOUNTER — Encounter: Payer: Self-pay | Admitting: Internal Medicine

## 2014-07-15 ENCOUNTER — Telehealth: Payer: Self-pay | Admitting: *Deleted

## 2014-07-15 MED ORDER — AMOXICILLIN-POT CLAVULANATE 875-125 MG PO TABS: 1.0000 | ORAL_TABLET | Freq: Two times a day (BID) | ORAL | Status: DC

## 2014-07-15 MED ORDER — AZITHROMYCIN 500 MG PO TABS: 500.0000 mg | ORAL_TABLET | Freq: Every day | ORAL | Status: DC

## 2014-07-15 NOTE — Telephone Encounter (Signed)
Patient stated that she cannot take either of the antibodics that were sent in today. Patient stated that she has a reaction to both meds. Please advise?

## 2014-07-15 NOTE — Telephone Encounter (Signed)
Neither of the prescribed meds are listed on her allergy sheet and she was given both when she was in [place]

## 2014-07-16 NOTE — Assessment & Plan Note (Signed)
She is not willing to do B12 injections Will start oral B12 replacement therapy

## 2014-07-16 NOTE — Assessment & Plan Note (Signed)
She does not have any alarming s/s I have asked her to follow up with pulm about this

## 2014-07-16 NOTE — Assessment & Plan Note (Signed)
Despite being treated in [place] as an inpatient for 6 days she has a CXR appearance that appears to be worsening Will try another round of antibiotics with zithromax and augmentin I have also asked her to see pulm for further evaluation

## 2014-07-17 ENCOUNTER — Telehealth: Payer: Self-pay | Admitting: Neurology

## 2014-07-17 ENCOUNTER — Other Ambulatory Visit (INDEPENDENT_AMBULATORY_CARE_PROVIDER_SITE_OTHER): Payer: Commercial Managed Care - HMO

## 2014-07-17 ENCOUNTER — Ambulatory Visit (INDEPENDENT_AMBULATORY_CARE_PROVIDER_SITE_OTHER): Payer: Commercial Managed Care - HMO | Admitting: Internal Medicine

## 2014-07-17 ENCOUNTER — Encounter: Payer: Self-pay | Admitting: Internal Medicine

## 2014-07-17 VITALS — BP 112/70 | HR 112 | Ht 66.0 in | Wt 99.6 lb

## 2014-07-17 DIAGNOSIS — J189 Pneumonia, unspecified organism: Secondary | ICD-10-CM

## 2014-07-17 DIAGNOSIS — J438 Other emphysema: Secondary | ICD-10-CM

## 2014-07-17 LAB — CBC WITH DIFFERENTIAL/PLATELET
BASOS ABS: 0.1 10*3/uL (ref 0.0–0.1)
Basophils Relative: 1.2 % (ref 0.0–3.0)
EOS PCT: 0.8 % (ref 0.0–5.0)
Eosinophils Absolute: 0 10*3/uL (ref 0.0–0.7)
HEMATOCRIT: 38 % (ref 36.0–46.0)
HEMOGLOBIN: 12.4 g/dL (ref 12.0–15.0)
LYMPHS ABS: 1.3 10*3/uL (ref 0.7–4.0)
LYMPHS PCT: 20.2 % (ref 12.0–46.0)
MCHC: 32.7 g/dL (ref 30.0–36.0)
MCV: 95.3 fl (ref 78.0–100.0)
MONOS PCT: 5.4 % (ref 3.0–12.0)
Monocytes Absolute: 0.3 10*3/uL (ref 0.1–1.0)
Neutro Abs: 4.5 10*3/uL (ref 1.4–7.7)
Neutrophils Relative %: 72.4 % (ref 43.0–77.0)
Platelets: 404 10*3/uL — ABNORMAL HIGH (ref 150.0–400.0)
RBC: 3.98 Mil/uL (ref 3.87–5.11)
RDW: 13.9 % (ref 11.5–15.5)
WBC: 6.2 10*3/uL (ref 4.0–10.5)

## 2014-07-17 LAB — SEDIMENTATION RATE: Sed Rate: 50 mm/hr — ABNORMAL HIGH (ref 0–22)

## 2014-07-17 NOTE — Patient Instructions (Signed)
Try prilosec 20mg   Take 30-60 min before first meal of the day and Pepcid 20 mg one bedtime until cough is completely gone for at least a week without the need for cough suppression  GERD (REFLUX)  is an extremely common cause of respiratory symptoms just like yours , many times with no obvious heartburn at all.    It can be treated with medication, but also with lifestyle changes including avoidance of late meals, excessive alcohol, smoking cessation, and avoid fatty foods, chocolate, peppermint, colas, red wine, and acidic juices such as orange juice.  NO MINT OR MENTHOL PRODUCTS SO NO COUGH DROPS  USE SUGARLESS CANDY INSTEAD (Jolley ranchers or Stover's or Life Savers) or even ice chips will also do - the key is to swallow to prevent all throat clearing. NO OIL BASED VITAMINS - use powdered substitutes.  Please remember to go to the lab  department downstairs for your tests - we will call you with the results when they are available.   Please schedule a follow up visit in 3 weeks but call sooner if needed   With cxr

## 2014-07-17 NOTE — Telephone Encounter (Signed)
I called the patient. She indicated that she is followed by LN at this time. She did not want to schedule a follow up MRI of the brain at this time.

## 2014-07-17 NOTE — Progress Notes (Signed)
Subjective:    [name], female    DOB: [birth year],  MRN: [id number]  HPI  [age] yowf never smoker with problems with sinus dating 30s [place] [facility] [place] and freq cough assoc pnds improved some in [place] then acutely worse Jun 29 2014  while [place] hosp x 6 days dx was CAP with symptoms fever, aches,  L lower ant cp somewhat migratory and persistent abn on cxr so referred to pulmonary clinic 07/17/2014 by Dr Yetta BarreJones   07/17/2014 1st [facility] office visit/ Catherine Gregory   Chief Complaint  Patient presents with  . Pulmonary Consult    Referred by Dr. Sanda Lingerhomas Jones. Pt states was dxed with PNA on 06/29/14 while in [place]. She was hospitalized for 7 days. She states that she had been having SOB, cough and CP but she feels much improved now.   "my cxr always shows emphysema" but not pt denies any baseline doe and though was sob when acutely ill in [place] is back to baseline now and Not limited by breathing from desired activities    No obvious day to day or daytime variabilty is breathing or assoc chronic cough or cp or chest tightness, subjective wheeze overt sinus or hb symptoms. No unusual exp hx or h/o childhood pna/ asthma or knowledge of premature birth.  Sleeping ok without nocturnal  or early am exacerbation  of respiratory  c/o's or need for noct saba. Also denies any obvious fluctuation of symptoms with weather or environmental changes or other aggravating or alleviating factors except as outlined above   Current Medications, Allergies, Complete Past Medical History, Past Surgical History, Family History, and Social History were reviewed in [facility] electronic medical record.           Review of Systems  Constitutional: Negative for fever, chills and unexpected weight change.  HENT: Negative for congestion, dental problem, ear pain, nosebleeds, postnasal drip, rhinorrhea, sinus pressure, sneezing, sore throat, trouble swallowing and voice change.     Eyes: Negative for visual disturbance.  Respiratory: Positive for cough. Negative for choking and shortness of breath.   Cardiovascular: Negative for chest pain and leg swelling.  Gastrointestinal: Negative for vomiting, abdominal pain and diarrhea.  Genitourinary: Negative for difficulty urinating.  Musculoskeletal: Negative for arthralgias.  Skin: Negative for rash.  Neurological: Negative for tremors, syncope and headaches.  Hematological: Does not bruise/bleed easily.       Objective:   Physical Exam amb hoarse wf nad  Wt Readings from Last 3 Encounters:  07/17/14 99 lb 9.6 oz (45.178 kg)  07/14/14 96 lb 12 oz (43.886 kg)  07/14/14 96 lb 3.2 oz (43.636 kg)    Vital signs reviewed   HEENT: nl dentition, turbinates, and orophanx. Nl external ear canals without cough reflex   NECK :  without JVD/Nodes/TM/ nl carotid upstrokes bilaterally   LUNGS: no acc muscle use, clear to A and P bilaterally without cough on insp or exp maneuvers   CV:  RRR  no s3 or murmur or increase in P2, no edema   ABD:  soft and nontender with nl excursion in the supine position. No bruits or organomegaly, bowel sounds nl  MS:  warm without deformities, calf tenderness, cyanosis or clubbing  SKIN: warm and dry without lesions    NEURO:  alert, approp, no deficits    cxr 07/14/14 Findings worrisome for development of multifocal infection, right greater than left, superimposed on advanced emphysematous change  Lab Results  Component Value Date   WBC 6.2 07/17/2014   HGB 12.4 07/17/2014   HCT 38.0 07/17/2014   MCV 95.3 07/17/2014   PLT 404.0* 07/17/2014        Lab Results  Component Value Date   ESRSEDRATE 50* 07/17/2014          Assessment & Plan:

## 2014-07-18 NOTE — Assessment & Plan Note (Signed)
Strongly doubt she any significant airflow obst though she does have hx to suggest she might have had low grade asthma at some point in her life.  In the absence of any ongoing symptoms this does not warrant further w/u noting the fletcher curve predicts everyone by the age of 69 has some evidence of emphysema if you look hard enough for it as the elastic recoil properties of the lung decline with natural aging

## 2014-07-18 NOTE — Progress Notes (Signed)
Quick Note:  Spoke with pt and notified of results per Dr. Wert. Pt verbalized understanding and denied any questions.  ______ 

## 2014-07-18 NOTE — Assessment & Plan Note (Signed)
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

## 2014-07-31 ENCOUNTER — Ambulatory Visit (INDEPENDENT_AMBULATORY_CARE_PROVIDER_SITE_OTHER): Payer: Commercial Managed Care - HMO | Admitting: *Deleted

## 2014-07-31 ENCOUNTER — Ambulatory Visit: Payer: Commercial Managed Care - HMO

## 2014-07-31 DIAGNOSIS — Z23 Encounter for immunization: Secondary | ICD-10-CM

## 2014-08-06 ENCOUNTER — Ambulatory Visit (INDEPENDENT_AMBULATORY_CARE_PROVIDER_SITE_OTHER)
Admission: RE | Admit: 2014-08-06 | Discharge: 2014-08-06 | Disposition: A | Discharge status: Home / Self Care | Payer: Commercial Managed Care - HMO | Source: Ambulatory Visit | Attending: Internal Medicine | Admitting: Internal Medicine

## 2014-08-06 ENCOUNTER — Ambulatory Visit (INDEPENDENT_AMBULATORY_CARE_PROVIDER_SITE_OTHER): Payer: Commercial Managed Care - HMO | Admitting: Internal Medicine

## 2014-08-06 ENCOUNTER — Encounter: Payer: Self-pay | Admitting: Internal Medicine

## 2014-08-06 DIAGNOSIS — J189 Pneumonia, unspecified organism: Secondary | ICD-10-CM

## 2014-08-06 NOTE — Progress Notes (Signed)
Subjective:    [name], female    DOB: April 26, 1946,  MRN: [id number]    Brief patient profile:  29 yowf never smoker with problems with sinus dating 30s eval by allergist in [facility] and freq cough assoc pnds improved some in [place] then acutely worse Jun 29 2014  while [place] hosp x 6 days dx was CAP with symptoms fever, aches,  L lower ant cp somewhat migratory and persistent abn on cxr so referred to pulmonary clinic 07/17/2014 by Dr Yetta Barre   History of Present Illness  07/17/2014 1st [place] [facility] office visit/ Wert   Chief Complaint  Patient presents with  . Pulmonary Consult    Referred by Dr. Sanda Linger. Pt states was dxed with PNA on 06/29/14 while in [place]. She was hospitalized for 7 days. She states that she had been having SOB, cough and CP but she feels much improved now.   "my cxr always shows emphysema" but not pt denies any baseline doe and though was sob when acutely ill in [place] is back to baseline now and Not limited by breathing from desired activities  rec Try prilosec 20mg   Take 30-60 min before first meal of the day and Pepcid 20 mg one bedtime   GERD diet   08/06/2014 f/u ov/Wert re: s/p pna  Chief Complaint  Patient presents with  . Follow-up    CXR done today. breathing same, not much problem with breathing from PNA; no complaints  still coughing   But non productive, able to sleep s am exac   No obvious day to day or daytime variabilty or assoc sob or cp or chest tightness, subjective wheeze overt sinus or hb symptoms. No unusual exp hx or h/o childhood pna/ asthma or knowledge of premature birth.  Sleeping ok without nocturnal  or early am exacerbation  of respiratory  c/o's or need for noct saba. Also denies any obvious fluctuation of symptoms with weather or environmental changes or other aggravating or alleviating factors except as outlined above   Current Medications, Allergies, Complete Past Medical History, Past Surgical  History, Family History, and Social History were reviewed in [facility] record.  ROS  The following are not active complaints unless bolded sore throat, dysphagia, dental problems, itching, sneezing,  nasal congestion or excess/ purulent secretions, ear ache,   fever, chills, sweats, unintended wt loss, pleuritic or exertional cp, hemoptysis,  orthopnea pnd or leg swelling, presyncope, palpitations, heartburn, abdominal pain, anorexia, nausea, vomiting, diarrhea  or change in bowel or urinary habits, change in stools or urine, dysuria,hematuria,  rash, arthralgias, visual complaints, headache, numbness weakness or ataxia or problems with walking or coordination,  change in mood/affect or memory.                         Objective:   Physical Exam  amb hoarse wf nad   08/06/2014       100  Wt Readings from Last 3 Encounters:  07/17/14 99 lb 9.6 oz (45.178 kg)  07/14/14 96 lb 12 oz (43.886 kg)  07/14/14 96 lb 3.2 oz (43.636 kg)    Vital signs reviewed   HEENT: nl dentition, turbinates, and orophanx. Nl external ear canals without cough reflex   NECK :  without JVD/Nodes/TM/ nl carotid upstrokes bilaterally   LUNGS: no acc muscle use, clear to A and P bilaterally without cough on insp or exp maneuvers   CV:  RRR  no s3 or murmur or increase in P2, no edema   ABD:  soft and nontender with nl excursion in the supine position. No bruits or organomegaly, bowel sounds nl  MS:  warm without deformities, calf tenderness, cyanosis or clubbing  SKIN: warm and dry without lesions    NEURO:  alert, approp, no deficits    cxr 07/14/14 Findings worrisome for development of multifocal infection, right greater than left, superimposed on advanced emphysematous change  08/06/14 cxr Persistent bilateral pulmonary nodular infiltrates.Some may be cavitating  Lab Results  Component Value Date   WBC 6.2 07/17/2014   HGB 12.4 07/17/2014   HCT 38.0 07/17/2014    MCV 95.3 07/17/2014   PLT 404.0* 07/17/2014        Lab Results  Component Value Date   ESRSEDRATE 50* 07/17/2014          Assessment & Plan:

## 2014-08-06 NOTE — Patient Instructions (Addendum)
Try prilosec 20mg   Take 30-60 min before first meal of the day and Pepcid 20 mg one bedtime until  Return   GERD (REFLUX)  is an extremely common cause of respiratory symptoms just like yours , many times with no obvious heartburn at all.    It can be treated with medication, but also with lifestyle changes including avoidance of late meals, excessive alcohol, smoking cessation, and avoid fatty foods, chocolate, peppermint, colas, red wine, and acidic juices such as orange juice.  NO MINT OR MENTHOL PRODUCTS SO NO COUGH DROPS  USE SUGARLESS CANDY INSTEAD (Jolley ranchers or Stover's or Life Savers) or even ice chips will also do - the key is to swallow to prevent all throat clearing. NO OIL BASED VITAMINS - use powdered substitutes.  Please remember to go to the xray   department downstairs for your tests - we will call you with the results when they are available.   Please schedule a follow up visit in  4 weeks with cxr and pfts    Late add CT chest rec

## 2014-08-07 ENCOUNTER — Telehealth: Payer: Self-pay | Admitting: Internal Medicine

## 2014-08-07 NOTE — Telephone Encounter (Signed)
Patient would like to discuss some issues with her synthroid   Please advise   Thank you

## 2014-08-07 NOTE — Telephone Encounter (Signed)
Called pt back. She said that she has been on the Synthroid for 3 weeks now. She continues to have stomach pain and discomfort. She is also having throat discomfort as well. Pt is really struggling taking the medication on an empty stomach. She has eaten directly after taking it for the past 2 days, due to how bad that she feels. Pt feels like this is the only way she can continue to take it. As of right now, she does not see much change since taking the Synthroid. Pt also states she has a lot of hoarseness and sometimes it is painful to her larynx due to that she has to stop talking for a while. She is seeing a pulmonologist because she had pneumonia and has not gotten better. Please advise if it is ok to eat right after taking the medication.

## 2014-08-07 NOTE — Telephone Encounter (Signed)
OK to eat right after she takes it >> try to do this consistently for the next month and return for repeat thyroid tests then and see if we need to change the LT4 dose.

## 2014-08-08 ENCOUNTER — Telehealth: Payer: Self-pay | Admitting: *Deleted

## 2014-08-08 DIAGNOSIS — J189 Pneumonia, unspecified organism: Secondary | ICD-10-CM

## 2014-08-08 NOTE — Telephone Encounter (Signed)
Ok to do non contrast as HRCT

## 2014-08-08 NOTE — Assessment & Plan Note (Signed)
-  symptoms acute while in [place] Jun 29 2014  - ESR 50  07/18/14   It's now been 6 week since onset so reasonable to do CT chest to eval for BOOP for atypical infection though note all the fever and aches are gone and the cough is non productive so active infectious process seems unlikely.

## 2014-08-08 NOTE — Telephone Encounter (Signed)
Called pt and advised her per Dr Gherghe's note. Pt understood.  

## 2014-08-08 NOTE — Telephone Encounter (Signed)
-----   Message from Nyoka Cowden, MD sent at 08/08/2014  6:17 AM EST ----- After review of all her records and labs I would rec we do a CT chest at this point - do it as CTangiogram since recent travel - to see why lung isn't healing better now 6 weeks out from pna

## 2014-08-08 NOTE — Telephone Encounter (Signed)
Spoke with the pt and notified of recs per MW  Pt verbalized understanding  She states that she is allergic to contrast dye "makes me shake all over and pass out" Please advise, thanks!

## 2014-08-08 NOTE — Telephone Encounter (Signed)
Ct chest high res without contrast ordered.  Nothing further needed.

## 2014-08-11 ENCOUNTER — Other Ambulatory Visit (INDEPENDENT_AMBULATORY_CARE_PROVIDER_SITE_OTHER): Payer: Commercial Managed Care - HMO

## 2014-08-11 DIAGNOSIS — E039 Hypothyroidism, unspecified: Secondary | ICD-10-CM

## 2014-08-11 LAB — T4, FREE: FREE T4: 1.22 ng/dL (ref 0.60–1.60)

## 2014-08-11 LAB — TSH: TSH: 0.87 u[IU]/mL (ref 0.35–4.50)

## 2014-08-12 ENCOUNTER — Other Ambulatory Visit: Payer: Self-pay | Admitting: *Deleted

## 2014-08-12 DIAGNOSIS — E041 Nontoxic single thyroid nodule: Secondary | ICD-10-CM

## 2014-08-12 LAB — THYROID PEROXIDASE ANTIBODY: Thyroperoxidase Ab SerPl-aCnc: <1 [IU]/mL

## 2014-08-14 ENCOUNTER — Ambulatory Visit (INDEPENDENT_AMBULATORY_CARE_PROVIDER_SITE_OTHER)
Admission: RE | Admit: 2014-08-14 | Discharge: 2014-08-14 | Disposition: A | Discharge status: Home / Self Care | Payer: Commercial Managed Care - HMO | Source: Ambulatory Visit | Attending: Internal Medicine | Admitting: Internal Medicine

## 2014-08-14 DIAGNOSIS — J189 Pneumonia, unspecified organism: Secondary | ICD-10-CM

## 2014-08-25 ENCOUNTER — Telehealth: Payer: Self-pay | Admitting: Internal Medicine

## 2014-08-25 NOTE — Telephone Encounter (Signed)
Catherine Gregory, Let's send Tirosint DAW 50 mcg daily #30 with 1 refill to see if she tolerates this better.

## 2014-08-25 NOTE — Telephone Encounter (Signed)
Pt wants to come off her Synthroid. She feels worse than she did. Pt is having trouble taking it. Pt having so much trouble talking. Her throat and her esophagus is sore. She feels that this all started since taking the Synthroid. Please advise if pt needs to wean off the medication.

## 2014-08-25 NOTE — Telephone Encounter (Signed)
Pt would like a call back regarding the side effects she is having from her meds.

## 2014-08-26 MED ORDER — TIROSINT 50 MCG PO CAPS: 50.0000 ug | ORAL_CAPSULE | Freq: Every day | ORAL | Status: DC

## 2014-08-26 NOTE — Telephone Encounter (Signed)
Called pt and advised her. Pt ok with this. Will give it a try.

## 2014-08-26 NOTE — Addendum Note (Signed)
Addended by: Adline Mango B on: 08/26/2014 01:37 PM   Modules accepted: Orders, Medications

## 2014-08-27 ENCOUNTER — Telehealth: Payer: Self-pay | Admitting: Internal Medicine

## 2014-08-27 NOTE — Telephone Encounter (Signed)
Pt calling regarding new RX Dr. Elvera Lennox is sending in it will need a prior auth.

## 2014-08-28 NOTE — Telephone Encounter (Signed)
Dr Elvera Lennox is working on  PA for [employer].

## 2014-09-02 ENCOUNTER — Telehealth: Payer: Self-pay | Admitting: Neurology

## 2014-09-02 NOTE — Telephone Encounter (Signed)
Faxed PA in this morning.

## 2014-09-02 NOTE — Telephone Encounter (Signed)
Pt resch appt from feb to [phone] she wanted to come in sooner

## 2014-09-10 ENCOUNTER — Ambulatory Visit (INDEPENDENT_AMBULATORY_CARE_PROVIDER_SITE_OTHER)
Admission: RE | Admit: 2014-09-10 | Discharge: 2014-09-10 | Disposition: A | Discharge status: Home / Self Care | Payer: Commercial Managed Care - HMO | Source: Ambulatory Visit | Attending: Internal Medicine | Admitting: Internal Medicine

## 2014-09-10 ENCOUNTER — Ambulatory Visit (INDEPENDENT_AMBULATORY_CARE_PROVIDER_SITE_OTHER): Payer: Commercial Managed Care - HMO | Admitting: Internal Medicine

## 2014-09-10 ENCOUNTER — Encounter: Payer: Self-pay | Admitting: Internal Medicine

## 2014-09-10 ENCOUNTER — Other Ambulatory Visit (INDEPENDENT_AMBULATORY_CARE_PROVIDER_SITE_OTHER): Payer: Commercial Managed Care - HMO

## 2014-09-10 VITALS — BP 104/64 | HR 112 | Ht 66.0 in | Wt 104.0 lb

## 2014-09-10 DIAGNOSIS — R490 Dysphonia: Secondary | ICD-10-CM

## 2014-09-10 DIAGNOSIS — J189 Pneumonia, unspecified organism: Secondary | ICD-10-CM | POA: Diagnosis not present

## 2014-09-10 DIAGNOSIS — J438 Other emphysema: Secondary | ICD-10-CM

## 2014-09-10 DIAGNOSIS — J984 Other disorders of lung: Secondary | ICD-10-CM | POA: Diagnosis not present

## 2014-09-10 LAB — SEDIMENTATION RATE: Sed Rate: 21 mm/hr (ref 0–22)

## 2014-09-10 LAB — PULMONARY FUNCTION TEST
DL/VA % PRED: 103 %
DL/VA: 5.22 ml/min/mmHg/L
DLCO unc % pred: 77 %
DLCO unc: 20.77 ml/min/mmHg
FEF 25-75 Pre: 1.56 L/sec
FEF2575-%Pred-Pre: 75 %
FEV1-%Pred-Pre: 76 %
FEV1-PRE: 1.89 L
FEV1FVC-%Pred-Pre: 101 %
FEV6-%PRED-PRE: 77 %
FEV6-PRE: 2.43 L
FEV6FVC-%Pred-Pre: 103 %
FVC-%Pred-Pre: 74 %
FVC-Pre: 2.45 L
PRE FEV1/FVC RATIO: 77 %
Pre FEV6/FVC Ratio: 99 %
RV % pred: 90 %
RV: 2.05 L
TLC % PRED: 84 %
TLC: 4.51 L

## 2014-09-10 LAB — CBC WITH DIFFERENTIAL/PLATELET
BASOS PCT: 0.7 % (ref 0.0–3.0)
Basophils Absolute: 0 10*3/uL (ref 0.0–0.1)
EOS ABS: 0 10*3/uL (ref 0.0–0.7)
EOS PCT: 0.6 % (ref 0.0–5.0)
HEMATOCRIT: 42.6 % (ref 36.0–46.0)
Hemoglobin: 14.1 g/dL (ref 12.0–15.0)
Lymphocytes Relative: 22 % (ref 12.0–46.0)
Lymphs Abs: 1.4 10*3/uL (ref 0.7–4.0)
MCHC: 33.1 g/dL (ref 30.0–36.0)
MCV: 95.3 fl (ref 78.0–100.0)
Monocytes Absolute: 0.4 10*3/uL (ref 0.1–1.0)
Monocytes Relative: 6.8 % (ref 3.0–12.0)
Neutro Abs: 4.5 10*3/uL (ref 1.4–7.7)
Neutrophils Relative %: 69.9 % (ref 43.0–77.0)
Platelets: 231 10*3/uL (ref 150.0–400.0)
RBC: 4.47 Mil/uL (ref 3.87–5.11)
RDW: 14 % (ref 11.5–15.5)
WBC: 6.4 10*3/uL (ref 4.0–10.5)

## 2014-09-10 NOTE — Progress Notes (Signed)
PFT done today. 

## 2014-09-10 NOTE — Progress Notes (Signed)
Quick Note:  Spoke with pt and notified of results per Dr. Wert. Pt verbalized understanding and denied any questions.  ______ 

## 2014-09-10 NOTE — Patient Instructions (Addendum)
Please remember to go to the lab and x-ray department downstairs for your tests - we will call you with the results when they are available.  GERD (REFLUX)  is an extremely common cause of respiratory symptoms just like yours , many times with no obvious heartburn at all.    It can be treated with medication, but also with lifestyle changes including avoidance of late meals, excessive alcohol, smoking cessation, and avoid fatty foods, chocolate, peppermint, colas, red wine, and acidic juices such as orange juice.  NO MINT OR MENTHOL PRODUCTS SO NO COUGH DROPS  USE SUGARLESS CANDY INSTEAD (Jolley ranchers or Stover's or Life Savers) or even ice chips will also do - the key is to swallow to prevent all throat clearing. NO OIL BASED VITAMINS - use powdered substitutes.  You will need to see an ENT doctor next as I don't think the thyroid medication is what is causing your throat pain

## 2014-09-10 NOTE — Progress Notes (Signed)
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 Pulmonary Consult    Referred by Dr. Sanda Linger. Pt states was dxed with PNA on 06/29/14 while in [place]. She was hospitalized for 7 days. She states that she had been having SOB, cough and CP but she feels much improved now.   "my cxr always shows emphysema" but not pt denies any baseline doe and though was sob when acutely ill in [place] is back to baseline now and Not limited by breathing from desired activities  rec Try prilosec   Take 30-60 min before first meal of the day and Pepcid 20 mg one bedtime   GERD diet   08/06/2014 f/u ov/Catherine Gregory re: s/p pna  Chief Complaint  Patient presents with  . Follow-up    CXR done today. breathing same, not much problem with breathing from PNA; no complaints  still coughing   But non productive, able to sleep s am exac  rec Try prilosec   Take 30-60 min before first meal of the day and Pepcid 20 mg one bedtime until  Return  GERD (REFLUX)   Please remember to go to the xray   department downstairs for your tests - we will call you with the results when they are available. Please schedule a follow up visit in  4 weeks with cxr and pfts     09/10/2014 f/u ov/Catherine Gregory re: couigh p ? Pna/ ? BOOP Chief Complaint  Patient presents with    . Follow-up    PFT done today. Pt states her breathing is overall doing well. She states that her cough has slightly improved, but hoarsness is worse.        No obvious day to day or daytime variabilty or assoc sob or cp or chest tightness, subjective wheeze overt sinus or hb symptoms. No unusual exp hx or h/o childhood pna/ asthma or knowledge of premature birth.  Sleeping ok without nocturnal  or early am exacerbation  of respiratory  c/o's or need for noct saba. Also denies any obvious fluctuation of symptoms with weather or environmental changes or other aggravating or alleviating factors except as outlined above   Current Medications, Allergies, Complete Past Medical History, Past Surgical History, Family History, and Social History were reviewed in [employer] record.  ROS  The following are not active complaints unless bolded sore throat, dysphagia, dental problems, itching, sneezing,  nasal congestion or excess/ purulent secretions, ear ache,   fever, chills, sweats, unintended wt loss, pleuritic or exertional cp, hemoptysis,  orthopnea pnd or leg swelling, presyncope, palpitations, heartburn, abdominal pain, anorexia, nausea, vomiting, diarrhea  or change in bowel or urinary habits, change in stools or urine, dysuria,hematuria,  rash, arthralgias,  visual complaints, headache, numbness weakness or ataxia or problems with walking or coordination,  change in mood/affect or memory.                  Objective:   Physical Exam  amb hoarse wf nad   08/06/2014       100  >  09/10/2014 104  Wt Readings from Last 3 Encounters:  07/17/14 99 lb 9.6 oz (45.178 kg)  07/14/14 96 lb 12 oz (43.886 kg)  07/14/14 96 lb 3.2 oz (43.636 kg)    Vital signs reviewed   HEENT: nl dentition, turbinates, and orophanx. Nl external ear canals without cough reflex   NECK :  without JVD/Nodes/TM/ nl carotid upstrokes bilaterally   LUNGS: no acc muscle use, clear to A and P  bilaterally without cough on insp or exp maneuvers   CV:  RRR  no s3 or murmur or increase in P2, no edema   ABD:  soft and nontender with nl excursion in the supine position. No bruits or organomegaly, bowel sounds nl  MS:  warm without deformities, calf tenderness, cyanosis or clubbing  SKIN: warm and dry without lesions          CXR PA and Lateral:   09/10/2014 :     I personally reviewed images and agree with radiology impression as follows:        Labs ordered today /   reviewed include:     Lab Results  Component Value Date   ESRSEDRATE 21 09/10/2014   ESRSEDRATE 50* 07/17/2014    Lab Results  Component Value Date   WBC 6.4 09/10/2014   HGB 14.1 09/10/2014   HCT 42.6 09/10/2014   MCV 95.3 09/10/2014   PLT 231.0 09/10/2014  No eos      Assessment & Plan:

## 2014-09-15 ENCOUNTER — Encounter: Payer: Self-pay | Admitting: Internal Medicine

## 2014-09-15 DIAGNOSIS — R490 Dysphonia: Secondary | ICD-10-CM | POA: Insufficient documentation

## 2014-09-15 LAB — HISTOPLASMA ANTIBODIES: Histoplasma Ab, Immunodiffusion: NEGATIVE

## 2014-09-15 NOTE — Assessment & Plan Note (Addendum)
eval for vcd [facility] early 2000s > rec ent f/u 09/10/14 and max gerd rx in meantime

## 2014-09-15 NOTE — Assessment & Plan Note (Addendum)
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

## 2014-09-15 NOTE — Assessment & Plan Note (Addendum)
pfts s airflow obst 09/10/14   She has "hyperinflation" on cxr but no no significant emphysema or HRCT so does not have  copd and so no rx needed

## 2014-09-16 ENCOUNTER — Ambulatory Visit (INDEPENDENT_AMBULATORY_CARE_PROVIDER_SITE_OTHER): Payer: Commercial Managed Care - HMO | Admitting: Neurology

## 2014-09-16 ENCOUNTER — Encounter: Payer: Self-pay | Admitting: Neurology

## 2014-09-16 VITALS — BP 124/70 | HR 103 | Ht 66.0 in | Wt 102.4 lb

## 2014-09-16 DIAGNOSIS — R930 Abnormal findings on diagnostic imaging of skull and head, not elsewhere classified: Secondary | ICD-10-CM

## 2014-09-16 DIAGNOSIS — E46 Unspecified protein-calorie malnutrition: Secondary | ICD-10-CM

## 2014-09-16 DIAGNOSIS — R209 Unspecified disturbances of skin sensation: Secondary | ICD-10-CM | POA: Diagnosis not present

## 2014-09-16 DIAGNOSIS — R9089 Other abnormal findings on diagnostic imaging of central nervous system: Secondary | ICD-10-CM

## 2014-09-16 LAB — CREATININE, SERUM: CREATININE: 0.53 mg/dL (ref 0.50–1.10)

## 2014-09-16 NOTE — Patient Instructions (Signed)
1.  MRI brain wwo contrast 2.  Strongly encouraged to establish care with a counselor 3.  Telephone update with results

## 2014-09-16 NOTE — Progress Notes (Signed)
Follow-up Visit   Date: 09/16/2014  Catherine Gregory MRN: [id number] DOB: [birth year]   Interim History: Catherine Gregory is a 69 y.o. right-handed Caucasian female with GERD, COPD, fibromyalgia, IBS, and anxiety returning to the clinic for follow-up of idiopathic peripheral neuropathy.  The patient was accompanied to the clinic by self.   History of present illness: Starting in 1998, she developed numbness/tingling of the hands and feet and was diagnosed with idiopathic peripheral neuropathy. She has EMG of the left side which confirmed neuropathy (no reports to review). Symptoms had been relatively stable until about 66-months ago, when her paresthesias started to involve the neck, head, and face. She feels that there are numb places over her neck, tingling of her ears, sensation of eyelids pulling down, and lips feel swollen. She has previously seen neurologists in [place], [place], [place], and most recently Dr. Anne Hahn at [facility]. He ordered MRI brain which showed mild white matter changes, but unchanged from previous MRI brain. Due to possible concern of multiple sclerosis, CSF testing was discussed, but patient declined tested as she is unable to lay flat due to chronic cough. Therefore, it was decided to have follow-up MRI brain imaging to look for progression of disease, but did not have this. She complains of problems with balance, but denies any falls, and ambulates independently.  She has many other chronic somatic complaints including, feeling as if her face is distorted, sporadic brief sharp pain occuring throughout her, skin changes, digestive problems, chronic cough, breathing problems, and migraines. She has since seen opthalmologist and endocrinologist with negative evaluation.   Of note, she recently moved from [place], [place] where she was living with her daughter and now residing with her brother and sister-in-law. She endorses a significant amount of  stress lately, especially with her recent move and in planning on moving back in with her daughter after she relocates to [place].  Per her last clinic note at [facility], "The patient has had nerve conduction studies that show a mild peripheral neuropathy, and a mild left ulnar neuropathy. The patient has a history of a positive ANA associated with joint pain. MRI of the cervical spine as been done in December 2007, and did not show evidence of spinal cord lesions. Degenerative changes were seen at the C4-5 and C5-6 levels with disc bulges as well. MRI of the lumbosacral spine was unremarkable. The patient does have nonspecific white matter changes on a recent MRI the brain. Her recent MRI the cervical spine did not show any cord lesions. Mild spondylosis."   UPDATE 09/16/2014: She continues to have burning stinging sensation of her face and neck.  She reports not having "topical feeling" over the right neck and nose.  She also complains of eye soreness, eye socket soreness, and jaw pain.  At her last visit, we discussed symptoms being a manifestation of stress which she seems to acknowledge but did not seek behavioral therapy.  She had one week hospitalization in November 2015 for pneumonia while visiting her daughter in [place], [place].   Medications:  Current Outpatient Prescriptions on File Prior to Visit  Medication Sig Dispense Refill  . Cholecalciferol (VITAMIN D3) LIQD 4,000 Units by Does not apply route daily.     . Cyanocobalamin (VITAMIN B-12 CR PO) Take 5,000 mcg by mouth. Dermal patch    . estradiol (CLIMARA - DOSED IN MG/24 HR) 0.025 mg/24hr patch Place 0.025 mg onto the skin once a week.    Marland Kitchen TIROSINT 50 MCG  CAPS Take 1 capsule (50 mcg total) by mouth daily before breakfast. 30 capsule 1   No current facility-administered medications on file prior to visit.    Allergies:  Allergies  Allergen Reactions  . Prednisone   . Augmentin [Amoxicillin-Pot Clavulanate]     Severe abdominal pain  .  Contrast Media [Iodinated Diagnostic Agents]     "skaking all over and passed out"  . Fentanyl And Related Other (See Comments)    Blood pressure drops  . Other     Fluorescien- body aches  . Albuterol   . Doxycycline   . Ducodyl [Bisacodyl]   . Erythromycin   . Levaquin [Levofloxacin In D5w]     Review of Systems:  CONSTITUTIONAL: No fevers, chills, night sweats, or weight loss.  EYES: No visual changes or eye pain ENT: No hearing changes.  No history of nose bleeds.   RESPIRATORY: No cough, wheezing and shortness of breath.   CARDIOVASCULAR: Negative for chest pain, and palpitations.   GI: Negative for abdominal discomfort, blood in stools or black stools.  No recent change in bowel habits.   GU:  No history of incontinence.   MUSCLOSKELETAL: No history of joint pain or swelling.  No myalgias.   SKIN: Negative for lesions, rash, and itching.   ENDOCRINE: Negative for cold or heat intolerance, polydipsia or goiter.   PSYCH:  + depression or anxiety symptoms.   NEURO: As Above.   Vital Signs:  BP 124/70 mmHg  Pulse 103  Ht  (1.676 m)  Wt 102 lb 6 oz (46.437 kg)  BMI 16.53 kg/m2  SpO2 98%   General: Very thing appearing female, anxious, tremulous at times Neck: No carotid bruit CV: RRR Ext: No edema  Neurological Exam: MENTAL STATUS including orientation to time, place, person, recent and remote memory, attention span and concentration, language, and fund of knowledge is normal.  Speech is not dysarthric.  CRANIAL NERVES: No visual field defects. Pupils equal round and reactive to light.  Normal conjugate, extra-ocular eye movements in all directions of gaze.  No ptosis. Normal facial sensation.  Face is symmetric. Palate elevates symmetrically.  Tongue is midline.  MOTOR:  Motor strength is 5/5 in all extremities.  Very thing appearing. No pronator drift.  Tone is normal.    MSRs:  Reflexes are 2+/4 throughout, except 1+/4 at the Achilles.  SENSORY:  Intact to  pin prick, vibration, and light touch throughout except patchy area on the neck.  COORDINATION/GAIT:  Gait narrow based and stable.  She is mildly unsteady with tandem gait.  Stressed gait is intact.   Data: Labs 02/20/2014:  Vitamin B12 325, vitamin B6 5.9, zinc 78, SPEP/UPEP with IFE no M protein Lab Results  Component Value Date   TSH 0.87 08/11/2014   Labs 08/13/2013:  ACE 51, Lyme neg, copper 116, ANA neg  MRI cervical spine 10/11/2013: Abnormal MRI scan of cervical spine showing mild spondylitic changes at C4-5 and C5-6 resulting in mild left to right foraminal narrowing but without definite compression. No enhancing lesions are noted.  MRI brain 09/12/2013:  Abnormal MRI brain showing nonspecific supratentorial white matter hyperintensities   IMPRESSION: Catherine Gregory is a 69 year-old female returning for evaluation of generalized whole body paresthesias. Her neurological is non-focal and although she may have signs of early distal neuropathy affecting the feet, I do not feel that her face and upper extremity symptoms are due to idiopathic peripheral neuropathy. I have also reviewed her MRI brain and  there is evidence of scattered white matter changes involving the subcortical region. In the past, the question of multiple sclerosis has been raised and I explained that to determine if, indeed, her MRI changes are due to demyelinating disease, lumbar puncture is the next step, but patient is very relunctant to have it performed due to inability to lay flat following the procedure. My clinical suspicion for multiple sclerosis is low based on her history and lack of upper motor neuron findings. I feel that some of her symptoms may be due to overlaying stress/anxiety especially since she endorses worsening of symptoms surrounding her recent move which she surprisingly seems to agree with.   Her laboratory work-up for neuropathy has been negative.  At this juncture, I would recommend repeat MRI  brain wwo contrast.  If no new changes, symptoms are most likely manifestation of stress/anxiety.  She also has been underweight for many years and reports since starting thyroid medication, her weight has come up slightly.  I encouraged her to increase her PO intake and add ensure to her diet.   PLAN/RECOMMENDATIONS:  1.  MRI brain wwo contrast 2.  Strongly recommended that she start seeing a behavioral therapist 3.  Return to clinic as needed   The duration of this appointment visit was 25 minutes of face-to-face time with the patient.  Greater than 50% of this time was spent in counseling, explanation of diagnosis, planning of further management, and coordination of care.   Thank you for allowing me to participate in patient's care.  If I can answer any additional questions, I would be pleased to do so.    Sincerely,    Kila Godina K. Allena Katz, DO

## 2014-09-24 ENCOUNTER — Other Ambulatory Visit (INDEPENDENT_AMBULATORY_CARE_PROVIDER_SITE_OTHER): Payer: Commercial Managed Care - HMO

## 2014-09-24 DIAGNOSIS — E041 Nontoxic single thyroid nodule: Secondary | ICD-10-CM

## 2014-09-24 LAB — T4, FREE: Free T4: 1.02 ng/dL (ref 0.60–1.60)

## 2014-09-24 LAB — TSH: TSH: 1.44 u[IU]/mL (ref 0.35–4.50)

## 2014-09-26 ENCOUNTER — Telehealth: Payer: Self-pay | Admitting: Internal Medicine

## 2014-09-26 NOTE — Telephone Encounter (Signed)
Called pt and advised her of her lab results (from the MyChart message). Pt understood, but states she is not feeling any better. Pt wants to see Dr Elvera Lennox to discuss her medication. Be advised.

## 2014-09-26 NOTE — Telephone Encounter (Signed)
Patient would like to know if her lab results are available    Please advise    Thank you

## 2014-09-27 ENCOUNTER — Ambulatory Visit
Admission: RE | Admit: 2014-09-27 | Discharge: 2014-09-27 | Disposition: A | Discharge status: Home / Self Care | Payer: Commercial Managed Care - HMO | Source: Ambulatory Visit | Attending: Neurology | Admitting: Neurology

## 2014-09-27 DIAGNOSIS — E46 Unspecified protein-calorie malnutrition: Secondary | ICD-10-CM

## 2014-09-27 DIAGNOSIS — I639 Cerebral infarction, unspecified: Secondary | ICD-10-CM | POA: Diagnosis not present

## 2014-09-27 DIAGNOSIS — R209 Unspecified disturbances of skin sensation: Secondary | ICD-10-CM | POA: Diagnosis not present

## 2014-09-27 DIAGNOSIS — R9089 Other abnormal findings on diagnostic imaging of central nervous system: Secondary | ICD-10-CM

## 2014-09-27 MED ORDER — GADOBENATE DIMEGLUMINE 529 MG/ML IV SOLN
9.0000 mL | Freq: Once | INTRAVENOUS | Status: AC | PRN
  Administered 2014-09-27: 9 mL via INTRAVENOUS

## 2014-09-30 ENCOUNTER — Ambulatory Visit (INDEPENDENT_AMBULATORY_CARE_PROVIDER_SITE_OTHER): Payer: Commercial Managed Care - HMO | Admitting: Internal Medicine

## 2014-09-30 ENCOUNTER — Encounter: Payer: Self-pay | Admitting: Internal Medicine

## 2014-09-30 DIAGNOSIS — E039 Hypothyroidism, unspecified: Secondary | ICD-10-CM | POA: Diagnosis not present

## 2014-09-30 MED ORDER — TIROSINT 25 MCG PO CAPS: 25.0000 ug | ORAL_CAPSULE | Freq: Every day | ORAL | Status: DC

## 2014-09-30 MED ORDER — LEVOTHYROXINE SODIUM 25 MCG PO CAPS: 25.0000 ug | ORAL_CAPSULE | Freq: Every day | ORAL | Status: DC

## 2014-09-30 NOTE — Progress Notes (Signed)
[name], female   DOB: [birth year], 69 y.o.   MRN: [id number]   HPI  Catherine Gregory is a 69 y.o.-year-old female, returning for f/u for  hypothyroidism.  Reviewed hx: She returns after a hospitalization in [place], [place] - admitted 06/29/2014. She had flu-like sxs and then developed CP >> dx with PNA. Her TSH was found to be very high, at 17 >> started on LT4 50 mcg daily.  She could not tolerate the Levothyroxine >> switched to Synthroid DAW >> could not tolerate it >> switched to Tirosint >> could not tolerate it >> cam today to discuss alternatives.  Problems: - dysphagia - cough - hoarseness - stomach pain - sweating  She started to take Tirosint >> 50 mcg daily: - in am - fasting  - eats b'fast immediately after - no Ca, iron, PPI, MVI  After Tirosint, some sxs have exacerbated: - sweating - stinging and burning of face and neck - eyes sore - nose hurts - neck pain - shoulder/knees pain  (arthritis pain was worse last week) - started with Tirosint  Pt c/o: - + fatigue - + decreased appetite - + heat intolerance/cold intolerance - + tremors - no palpitations - no anxiety/depression - + N/reflux - + weight loss (over time) - but recently has gained a little weight, about which she is very happy - no recently hair falling  Thyroid U/S (12/15/2011): no thyroid nodules, possible acute vs chronic mild thyroiditis  I reviewed pt's thyroid tests: Lab Results  Component Value Date   TSH 1.44 09/24/2014   TSH 0.87 08/11/2014   TSH 0.96 01/30/2014   TSH 4.10 10/10/2013   FREET4 1.02 09/24/2014   FREET4 1.22 08/11/2014   FREET4 0.92 01/30/2014  11.03.2015: TSH 17.06, fT4 1.24 (0.6-1.6), fT3 2.0    She had testing for adrenal insufficiency in the past >> stimulation test normal, 3 years ago.  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 She also had swallowing problems, hoarseness.  Her B12 was low, at 292, as per check at her last  hospitalization in [place]. Started dermal patch of vitamin B12 supplementation.  Patient has a history of aspiration PNA several times, has a hiatal hernia, has GERD. She had 4-5 EGDs.   ROS: Constitutional: see HPI, had fever and chills recently, + nocturia Eyes: no blurry vision, no xerophthalmia ENT: + sore throat, no nodules palpated in throat, + dysphagia/no odynophagia, + hoarseness Cardiovascular: no CP/SOB/no palpitations/leg swelling Respiratory: + cough/no SOB/+ wheezing Gastrointestinal: + N/no V/D/C/+ heartburn  Musculoskeletal: + muscle/+ joint aches Skin: + itching, + rash Neurological: + fine tremors/numbness/tingling/dizziness, + HA  I reviewed pt's medications, allergies, PMH, social hx, family hx, and changes were documented in the history of present illness. Otherwise, unchanged from my initial visit note.  PE: BP 100/60 mmHg  Pulse 109  Temp(Src) 97.7 F (36.5 C) (Oral)  Resp 12  Wt 105 lb 12.8 oz (47.991 kg)  SpO2 98% Wt Readings from Last 3 Encounters:  09/30/14 105 lb 12.8 oz (47.991 kg)  09/16/14 102 lb 6 oz (46.437 kg)  09/10/14 104 lb (47.174 kg)   Constitutional: underweight, appears weak and tremulous, soft voice, otherwise in NAD Eyes: PERRLA, EOMI, no exophthalmos ENT: moist mucous membranes, no thyromegaly, no thyroid nodule palpated; no cervical lymphadenopathy Cardiovascular: tachycardia, RR, No MRG Respiratory: CTA B Gastrointestinal: abdomen soft, NT, ND, BS+ Musculoskeletal: no deformities, strength intact in all 4;  Skin: moist, warm, no rashes Neurological:  no tremor with outstretched hands, DTR normal in all 4  ASSESSMENT: 1. Hypothyroidism - recently started on levothyroxine, but has nausea and abdominal pain when taking - thyroid U/S 2013: no thyroid nodule  PLAN: 1. Pt with recent diagnosis of hypothyroidism, with several symptoms, mostly fatigue and also dysesthesia in face and the rest of the body (? Related to her thyroid  status). -  She was started on levothyroxine, but could not tolerate this because she had abdominal pain, nausea, dysphagia, and also weakness. We tried Synthroid brand name 50 mcg (no additives, no excipients) >> same d to Tirosint that she does better with but now has other sxs again related to her facial dysesthesia and also joint pains. Of note, she continues to have normal TFTs on the 50 mcg of LT4 in any of the above formulations. - we discussed about decreasing Tirosint to 25 mcg daily and see how she feels with this. She agrees and hopes that we can even stop it soon. - we discussed about correct intake of levothyroxine, every day, with water, >30 minutes before breakfast, separated by >4 hours from acid reflux medications, calcium, iron, multivitamins. She is taking it right before b'fast, which is OK, since labs normal and she could not tolerate to take it 30 min before - I advised her to come back for TSH and free T4 draw in 6 weeks. I hope we can take her off Tirosint then, even though her TSH may increase to the ULN or slightly above. - I will consider checking another cosyntropin stimulation test if she continues to feel this weak - I will see her back in 4 months

## 2014-09-30 NOTE — Patient Instructions (Signed)
Please decrease the dose of Tirosint to 25 mcg daily in am.  Please come back for labs in 6 weeks.  Please come back for a follow-up appointment in 4 months.

## 2014-10-10 ENCOUNTER — Telehealth: Payer: Self-pay | Admitting: *Deleted

## 2014-10-10 NOTE — Telephone Encounter (Signed)
Catherine Gregory called and left  A  Message that she is a patient of Dr. Erin Fulling and has questions about her estradiol prescription. Called [name] and she states she saw Dr. Burnice Logan- Katrinka Blazing last 01/2014 and at that time Dr. Burnice Logan- Katrinka Blazing thought she was having hot flashes so she suggested [name] double the amount of estradiol she was using to 0.5mg  patch weekly. States she has been doing that , but it wasn't making any difference. States she was still having hot flashes/ breaking out into a sweat/ cold 3-4 times every hour all day long, everyday. States so for the last few months she decided to lower the dose and she has been cutting the patch in half. Then decided maybe she shouldn't be doing that.  Wants to know what she should do. Also states she  Has been diagnosed with a thyroid problem, but the thyroid doctor did not think her hot flashes were related to her thyroid.  I informed her I am not sure she should be cutting her patches, but that her doctor is not here . I informed her I would send a message to the doctor and as soon as we hear back from her we will call her and that she may want her to come in to office or she may change her meds.

## 2014-10-14 ENCOUNTER — Telehealth: Payer: Self-pay

## 2014-10-14 ENCOUNTER — Ambulatory Visit: Payer: Medicare HMO | Admitting: Internal Medicine

## 2014-10-14 NOTE — Telephone Encounter (Signed)
Called patient and informed her a f/u appointment is necessary-- informed her front office staff will be calling her with an appointment; advised she call clinic if she has not heard of appointment by Friday 10/17/14. Also informed patient she should not be cutting her patches. Patient verbalized understanding. No further questions or concerns.

## 2014-10-14 NOTE — Telephone Encounter (Signed)
-----   Message from Willodean Rosenthal, MD sent at 10/14/2014  8:41 AM EST ----- This is a response to the nursing note left by Bonita Quin dated 2/12.  This pt needs a f/u visit.  She was last seen in June and has new health issues.  Also, please notify her that she should NOT cut her estradiol patches.  Thx, clh-S

## 2014-10-23 ENCOUNTER — Ambulatory Visit: Payer: Commercial Managed Care - HMO | Admitting: Neurology

## 2014-10-24 ENCOUNTER — Telehealth: Payer: Self-pay | Admitting: Neurology

## 2014-10-24 ENCOUNTER — Telehealth: Payer: Self-pay | Admitting: Internal Medicine

## 2014-10-24 ENCOUNTER — Other Ambulatory Visit: Payer: Self-pay | Admitting: *Deleted

## 2014-10-24 DIAGNOSIS — E039 Hypothyroidism, unspecified: Secondary | ICD-10-CM

## 2014-10-24 NOTE — Telephone Encounter (Signed)
Called patient back and she does want to see a counselor but would like to call and talk to them herself.  I asked her to have them call me with any information they may need.  I gave her the # to [facility].

## 2014-10-24 NOTE — Telephone Encounter (Signed)
Noted and agree.  Please send referral to behavioral therapy.  Jovaughn Wojtaszek K. Allena Katz, DO

## 2014-10-24 NOTE — Telephone Encounter (Signed)
Pt called wanting to speak to a nurse regarding a referral Dr. Allena Katz wanted to refer the patient to a counselor.  C/b [phone]

## 2014-10-24 NOTE — Telephone Encounter (Signed)
Returned pt's call. Pt said she has decided that she is going to stop taking the Tirosint. She wants to know if she can come off slowly or can she just stop. Discussed with Dr Elvera Lennox. She advised ok to come off and have labs done in 6 weeks to see if there is a change in her levels. Called pt and advised her. Labs scheduled for April 8th.

## 2014-10-24 NOTE — Telephone Encounter (Signed)
Patient called and would like to speak with Carollee Herter regarding her medication    Please advise   Cal [phone]   Thank you

## 2014-10-28 ENCOUNTER — Other Ambulatory Visit: Payer: Self-pay | Admitting: *Deleted

## 2014-10-28 DIAGNOSIS — F419 Anxiety disorder, unspecified: Secondary | ICD-10-CM

## 2014-10-28 NOTE — Telephone Encounter (Signed)
Order placed for behavioral medicine if patient decides to go.

## 2014-11-13 ENCOUNTER — Other Ambulatory Visit (INDEPENDENT_AMBULATORY_CARE_PROVIDER_SITE_OTHER): Payer: Commercial Managed Care - HMO

## 2014-11-13 ENCOUNTER — Encounter: Payer: Self-pay | Admitting: Internal Medicine

## 2014-11-13 ENCOUNTER — Ambulatory Visit (INDEPENDENT_AMBULATORY_CARE_PROVIDER_SITE_OTHER): Payer: Commercial Managed Care - HMO | Admitting: Internal Medicine

## 2014-11-13 VITALS — BP 106/64 | HR 110 | Temp 98.0°F | Resp 12 | Ht 66.0 in | Wt 107.0 lb

## 2014-11-13 DIAGNOSIS — G609 Hereditary and idiopathic neuropathy, unspecified: Secondary | ICD-10-CM | POA: Diagnosis not present

## 2014-11-13 LAB — COMPREHENSIVE METABOLIC PANEL WITH GFR
ALT: 18 U/L (ref 0–35)
AST: 18 U/L (ref 0–37)
Albumin: 4.1 g/dL (ref 3.5–5.2)
Alkaline Phosphatase: 70 U/L (ref 39–117)
BUN: 17 mg/dL (ref 6–23)
CO2: 30 meq/L (ref 19–32)
Calcium: 9.3 mg/dL (ref 8.4–10.5)
Chloride: 103 meq/L (ref 96–112)
Creatinine, Ser: 0.56 mg/dL (ref 0.40–1.20)
GFR: 114.01 mL/min
Glucose, Bld: 91 mg/dL (ref 70–99)
Potassium: 4.3 meq/L (ref 3.5–5.1)
Sodium: 137 meq/L (ref 135–145)
Total Bilirubin: 0.3 mg/dL (ref 0.2–1.2)
Total Protein: 6.9 g/dL (ref 6.0–8.3)

## 2014-11-13 LAB — VITAMIN D 25 HYDROXY (VIT D DEFICIENCY, FRACTURES): VITD: 39.58 ng/mL (ref 30.00–100.00)

## 2014-11-13 LAB — LIPID PANEL
Cholesterol: 196 mg/dL (ref 0–200)
HDL: 93.5 mg/dL
LDL Cholesterol: 93 mg/dL (ref 0–99)
NonHDL: 102.5
Total CHOL/HDL Ratio: 2
Triglycerides: 48 mg/dL (ref 0.0–149.0)
VLDL: 9.6 mg/dL (ref 0.0–40.0)

## 2014-11-13 LAB — CK: Total CK: 37 U/L (ref 7–177)

## 2014-11-13 LAB — VITAMIN B12: Vitamin B-12: 283 pg/mL (ref 211–911)

## 2014-11-13 NOTE — Progress Notes (Signed)
   Subjective:    [name], female    DOB: 1946/03/10, 69 y.o.   MRN: [id number]  HPI The patient is a 69 YO female who is coming in today for numbness and tingling in her face and neck. She has had it for about 3-6 months. Her first numbness symptoms happened about 5 years ago. She has seen several neurologists for her unexplained numbness over the years and had unchanged MRI brain 2 months ago. She is just wandering what the cause is and making sure that her B12 supplementation is working as the levels have not been checked in some time. She denies new symptoms or radiation or extension of her numbness. She denies pain. She does not want medication for her symptoms but wants to find the cause. She also states that her neurologist wants her to go to counseling and she has an appointment in April but she does not agree that this could be related.   Review of Systems  Constitutional: Positive for fatigue. Negative for fever, activity change, appetite change and unexpected weight change.  HENT: Negative.   Eyes: Negative.   Respiratory: Negative for cough, chest tightness, shortness of breath and wheezing.   Cardiovascular: Negative for chest pain, palpitations and leg swelling.  Gastrointestinal: Negative for nausea, abdominal pain, diarrhea, constipation and abdominal distention.  Musculoskeletal: Positive for gait problem. Negative for arthralgias.  Skin: Negative.   Neurological: Positive for weakness and numbness. Negative for dizziness, syncope and light-headedness.      Objective:   Physical Exam  Constitutional: She is oriented to person, place, and time. She appears well-developed and well-nourished.  HENT:  Head: Normocephalic and atraumatic.  Eyes: EOM are normal.  Neck: Normal range of motion.  Cardiovascular: Normal rate and regular rhythm.   Pulmonary/Chest: Effort normal and breath sounds normal. No respiratory distress. She has no wheezes. She has no  rales.  Abdominal: Soft. Bowel sounds are normal. She exhibits no distension. There is no tenderness.  Neurological: She is alert and oriented to person, place, and time. A cranial nerve deficit is present.  Decreased sensation to fine touch in the left neck and face and arm (slightly inconsistent exam with some areas she stated no feeling then felt in same spot several minutes later)  Skin: Skin is warm and dry.   Filed Vitals:   11/13/14 1301  BP: 106/64  Pulse: 110  Temp: 98 F (36.7 C)  TempSrc: Oral  Resp: 12  Height: 5\' 6"  (1.676 m)  Weight: 107 lb (48.535 kg)  SpO2: 93%      Assessment & Plan:  Visit time 25 minutes, greater than 50% of which was spent in face to face counseling with the patient.

## 2014-11-13 NOTE — Patient Instructions (Signed)
We will check some blood work today to check on the vitamin levels in the blood as well as the cholesterol and the liver and the kidneys.   I will check into whether there are other resources or clinics at [facility] or [facility] or [facility] about nerve disorders to see if they may have any more insights into what is causing the problem.   The medicine that I would likely recommend if you wanted to try something (if the nerve pains get worse) would be called lyrica. It is a medicine that works on the way nerve send signals to the brain to see if this would help. You do not have to try it and can research it if you like. It is used a lot for fibromyalgia.

## 2014-11-13 NOTE — Progress Notes (Signed)
Pre visit review using our clinic review tool, if applicable. No additional management support is needed unless otherwise documented below in the visit note. 

## 2014-11-13 NOTE — Assessment & Plan Note (Signed)
Check B12 level, vitamin D, vitamin A level (nighttime vision problems and skin problems). No hx diabetes. Unclear etiology. Has seen multiple neurologists for same without diagnosis. Will await her counseling to see if this helps with her symptoms. If not can consider tertiary referral for additional opinion.

## 2014-11-14 ENCOUNTER — Encounter: Payer: Self-pay | Admitting: Obstetrics & Gynecology

## 2014-11-14 ENCOUNTER — Ambulatory Visit (INDEPENDENT_AMBULATORY_CARE_PROVIDER_SITE_OTHER): Payer: Commercial Managed Care - HMO | Admitting: Obstetrics & Gynecology

## 2014-11-14 VITALS — BP 108/60 | HR 90 | Temp 97.8°F | Ht 66.0 in | Wt 106.7 lb

## 2014-11-14 DIAGNOSIS — N816 Rectocele: Secondary | ICD-10-CM

## 2014-11-14 MED ORDER — ESTRADIOL 0.025 MG/24HR TD PTWK: 0.0250 mg | MEDICATED_PATCH | TRANSDERMAL | Status: DC

## 2014-11-14 NOTE — Patient Instructions (Signed)
Conjugated Estrogens tablets  What is this medicine?  CONJUGATED ESTROGENS (CON ju gate ed ESS troe jenz) is an estrogen. It is used as hormone replacement in menopausal women. It helps to treat hot flashes and prevent osteoporosis. It is also used to treat women with low hormone levels or in those who have had their ovaries removed.  This medicine may be used for other purposes; ask your health care provider or pharmacist if you have questions.  COMMON BRAND NAME(S): Premarin  What should I tell my health care provider before I take this medicine?  They need to know if you have any of these conditions:  -abnormal vaginal bleeding  -blood vessel disease or blood clots  -breast, cervical, endometrial, ovarian, liver, or uterine cancer  -dementia  -diabetes  -endometriosis  -fibroids  -gallbladder disease  -heart disease or recent heart attack  -high blood pressure  -high cholesterol  -high level of calcium in the blood  -kidney disease  -liver disease  -mental depression  -migraine headaches  -protein C deficiency  -protein S deficiency  -stroke  -tobacco smoker  -an unusual or allergic reaction to estrogens, other medicines, foods, dyes, or preservatives  -pregnant or trying to get pregnant  -breast-feeding  How should I use this medicine?  Take this medicine by mouth with a glass of water. Follow the directions on the prescription label. Take your medicine at regular intervals, at the same time each day. Do not take your medicine more often than directed.  A patient package insert for the product will be given with each prescription and refill. Read this sheet carefully each time.  Talk to your pediatrician regarding the use of this medicine in children. This medicine is not approved for use in children.  Overdosage: If you think you have taken too much of this medicine contact a poison control center or emergency room at once.  NOTE: This medicine is only for you. Do not share this medicine with others.  What if I  miss a dose?  If you miss a dose, take it as soon as you can. If it is almost time for your next dose, take only that dose. Do not take double or extra doses.  What may interact with this medicine?  Do not take this medicine with any of the following medications:  -aromatase inhibitors like aminoglutethimide, anastrozole, exemestane, letrozole, testolactone  -metyrapone  This medicine may also interact with the following medications:  -barbiturates, such as phenobarbital  -carbamazepine  -clarithromycin  -erythromycin  -grapefruit juice  -medicines for fungal infections like ketoconazole and itraconazole  -phenytoin  - rifampin  -ritonavir  -St. John's Wort  -thyroid hormones  This list may not describe all possible interactions. Give your health care provider a list of all the medicines, herbs, non-prescription drugs, or dietary supplements you use. Also tell them if you smoke, drink alcohol, or use illegal drugs. Some items may interact with your medicine.  What should I watch for while using this medicine?  Visit your health care professional for regular checks on your progress. You will need a regular breast and pelvic exam and Pap smear while on this medicine. You should also discuss the need for regular mammograms with your health care professional, and follow his or her guidelines for these tests.  This medicine can make your body retain fluid, making your fingers, hands, or ankles swell. Your blood pressure can go up. Contact your doctor or health care professional if you feel you are retaining   fluid.  If you have any reason to think you are pregnant; stop taking this medicine at once and contact your doctor or health care professional.  Smoking increases the risk of getting a blood clot or having a stroke while you are taking this medicine, especially if you are more than 70 years old. You are strongly advised not to smoke.  If you wear contact lenses and notice visual changes, or if the lenses begin to  feel uncomfortable, consult your eye care specialist.  The tablet shell for some brands of this medicine does not dissolve. The tablet shell may appear whole in the stool. This is not a cause for concern. If you see something that resembles a tablet in your stool, talk to your healthcare provider.  This medicine can increase the risk of developing a condition (endometrial hyperplasia) that may lead to cancer of the lining of the uterus. Taking progestins, another hormone drug, with this medicine lowers the risk of developing this condition. Therefore, if your uterus has not been removed (by a hysterectomy), your doctor may prescribe a progestin for you to take together with your estrogen. You should know, however, that taking estrogens with progestins may have additional health risks. You should discuss the use of estrogens and progestins with your health care professional to determine the benefits and risks for you.  If you are going to have surgery, you may need to stop taking this medicine. Consult your health care professional for advice before you schedule the surgery.  What side effects may I notice from receiving this medicine?  Side effects that you should report to your doctor or health care professional as soon as possible:  -allergic reactions like skin rash, itching or hives, swelling of the face, lips, or tongue  -breast tissue changes or discharge  -changes in vision  -chest pain  -confusion, trouble speaking or understanding  -dark urine  -general ill feeling or flu-like symptoms  -light-colored stools  -nausea, vomiting  -pain, swelling, warmth in the leg  -right upper belly pain  -severe headaches  -shortness of breath  -sudden numbness or weakness of the face, arm or leg  -trouble walking, dizziness, loss of balance or coordination  -unusual vaginal bleeding  -yellowing of the eyes or skin  Side effects that usually do not require medical attention (report to your doctor or health care professional  if they continue or are bothersome):  -hair loss  -increased hunger or thirst  -increased urination  -symptoms of vaginal infection like itching, irritation or unusual discharge  -unusually weak or tired  This list may not describe all possible side effects. Call your doctor for medical advice about side effects. You may report side effects to FDA at 1-800-FDA-1088.  Where should I keep my medicine?  Keep out of the reach of children.  Store at room temperature between 15 and 30 degrees C (59 and 86 degrees F). Throw away any unused medicine after the expiration date.  NOTE: This sheet is a summary. It may not cover all possible information. If you have questions about this medicine, talk to your doctor, pharmacist, or health care provider.  © 2015, [employer]. (2010-11-17 09:20:56)

## 2014-11-14 NOTE — Progress Notes (Signed)
Subjective:     [name], female   DOB: 1946-04-06, 69 y.o.   MRN: [id number]  HPI  Pt has been on EES patch for PMP sx.  She reports that we increased the dosage to see if her hot flushing would improve.  It did not.  In the meantime she was dx'd with thyroid ds but, was unable to tolerate medication so is currently off thyroid meds and is being followed for  by primary care physician for this.  She has also been eval for numbness in her face and neck.  A neurologist is following this but, has found no etiology. She had an MRI which was normal.  She denies problems or changes re her GYN issues.  She reports that she was cutting the patch when she discovered that the increased dosage did not relieve her sx.   She was advised via telephone NOT to cut the patch.  She was asked to f/u to discuss her HRT.  She denies new GYN problems.   Review of Systems     Objective:   Physical Exam BP 108/60 mmHg  Pulse 90  Temp(Src) 97.8 F (36.6 C) (Oral)  Ht  (1.676 m)  Wt 106 lb 11.2 oz (48.399 kg)  BMI 17.23 kg/m2  Exam deferred     Assessment:     ERT/rectocele/vulvar atrophy- wants to continue EES but, at the lower dosage of 0.02mg / week    Plan:     Decrease Climara to 0.025mg /week F/u in 1 year or sooner prn

## 2014-11-16 LAB — VITAMIN A: VITAMIN A (RETINOIC ACID): 32 ug/dL — AB (ref 38–98)

## 2014-11-19 ENCOUNTER — Other Ambulatory Visit: Payer: Self-pay | Admitting: Obstetrics & Gynecology

## 2014-11-19 DIAGNOSIS — Z1231 Encounter for screening mammogram for malignant neoplasm of breast: Secondary | ICD-10-CM

## 2014-11-27 DIAGNOSIS — G35 Multiple sclerosis: Secondary | ICD-10-CM | POA: Diagnosis not present

## 2014-11-27 DIAGNOSIS — H40051 Ocular hypertension, right eye: Secondary | ICD-10-CM | POA: Diagnosis not present

## 2014-12-02 ENCOUNTER — Ambulatory Visit (HOSPITAL_COMMUNITY)
Admission: RE | Admit: 2014-12-02 | Discharge: 2014-12-02 | Disposition: A | Discharge status: Home / Self Care | Payer: Commercial Managed Care - HMO | Source: Ambulatory Visit | Attending: Obstetrics & Gynecology | Admitting: Obstetrics & Gynecology

## 2014-12-02 DIAGNOSIS — Z1231 Encounter for screening mammogram for malignant neoplasm of breast: Secondary | ICD-10-CM | POA: Insufficient documentation

## 2014-12-05 ENCOUNTER — Other Ambulatory Visit (INDEPENDENT_AMBULATORY_CARE_PROVIDER_SITE_OTHER): Payer: Commercial Managed Care - HMO

## 2014-12-05 DIAGNOSIS — E039 Hypothyroidism, unspecified: Secondary | ICD-10-CM | POA: Diagnosis not present

## 2014-12-05 LAB — TSH: TSH: 3.52 u[IU]/mL (ref 0.35–4.50)

## 2014-12-05 LAB — T3, FREE: T3, Free: 3 pg/mL (ref 2.3–4.2)

## 2014-12-05 LAB — T4, FREE: Free T4: 0.86 ng/dL (ref 0.60–1.60)

## 2014-12-11 DIAGNOSIS — M792 Neuralgia and neuritis, unspecified: Secondary | ICD-10-CM | POA: Diagnosis not present

## 2014-12-11 DIAGNOSIS — G609 Hereditary and idiopathic neuropathy, unspecified: Secondary | ICD-10-CM | POA: Diagnosis not present

## 2014-12-15 ENCOUNTER — Ambulatory Visit (INDEPENDENT_AMBULATORY_CARE_PROVIDER_SITE_OTHER): Payer: Medicare HMO | Admitting: Clinical

## 2014-12-15 ENCOUNTER — Encounter (HOSPITAL_COMMUNITY): Payer: Self-pay | Admitting: Clinical

## 2014-12-15 DIAGNOSIS — F419 Anxiety disorder, unspecified: Secondary | ICD-10-CM

## 2014-12-15 NOTE — Progress Notes (Signed)
Patient:   Catherine Gregory   DOB:   Apr 23, 1946  MR Number:  [id number]  Location:  [facility] [place] [address] [id number] [place] [address] Dept: [id number]           Date of Service:   12/15/2014  Start Time:   11:05 End Time:   12:10  Provider/Observer:  Erby Pian Counselor       Billing Code/Service: [phone]  Behavioral Observation: Nolon Nations  presents as a 69 y.o.-year-old Caucasian Female who appeared her stated age. her dress was Appropriate and she was Casual and Neat and her manners were Appropriate to the situation.  There were any physical disabilities noted. [name] walked with a cane and explained that she had trouble with her balance and several health issues.  she displayed an appropriate level of cooperation and motivation.    Interactions:    Active   Attention:   normal  Memory:   normal  Speech (Volume):  normal  Speech:   normal pitch and normal volume  Thought Process:  Coherent and Relevant  Though Content:  WNL  Orientation:   person, place and situation  Judgment:   Good  Planning:   Good  Affect:    Appropriate  Mood:    NA  Insight:   Fair  Intelligence:   normal  Chief Complaint:     Chief Complaint  Patient presents with  . Other    Reason for Service:  Referred by Dr. Allena Katz  Current Symptoms:  Physiological issues with no known  medical explanation, anxiety related to the multitude of medical issues  Source of Distress:             "My doctor  Referred me here, because they are not finding a medical reason for disturbance of skin sensation on the skin on my  face and neck, it stays constant and I can always feel the pain. The pain is such that it  feels as if my face should be distorted, but  its same as it was before." "The sensations are always there, sometimes better, sometimes worse. 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 My parents were divorced when I was 5."  "After several years my  mother remarried and moved out of the state."  "I lived with  an Engineer, mining, who had no children, for about 5 years.  At 65 my Aunt andI left and joined my mother in [place]." "Childhood was not without problems, but I feel like I dealt with any problems that resulted from it." "It was really complicated. When my parents married, neither family wanted them to. Father came from a well to do family, and mother was  poor. My father loved my mother very much, but my mother had schizophrenia  but nobody knew  it until she was an adult(age 73)." "Daddy was the chief of police, then he became an alcoholic.  Daddy not responsible and she would go back, it was a miserable life." Later my Mother married a man who was working his way up in home office and moved to [place]." "I did not go, it was my choice.In [place] I had everything, I had a privileged life while I was with my Aunt Uncle. I would go visit my Mom and siblings in [place], I decided to stay when I was 15. I was over all happier in [place] but by the time I decided to stay she was getting a divorce from her husband. It was a bad situation. I had two little half brothers and sisters and they needed me."   "I live in [place] with my brother, Catherine Gregory, and his wife for the past 1.5 years. They treat me wonderfully. They love each other. It is small but I cannot afford to live on my own. I  came her after living with my daughter for 15 years. She moved to [facility] I want to go to [place] and be with her. That's what I am hoping for."      Natural/Informal Support:                           My daughter, My brother Catherine Gregory, sister in law Marylu Lund , former sister in law Coy Saunas, my daughters mother in law -[place] , and other family members   Substance Use:  No concerns of substance abuse are reported.     Medical History:   Past Medical History  Diagnosis Date  . Disturbance of skin sensation 08/13/2013  . Myalgia and myositis, unspecified 08/13/2013  . Depression with anxiety   . GERD (gastroesophageal reflux disease)   . Hiatal hernia   . Malnutrition   . Migraine   . Renal calculus   . Dysphagia, idiopathic   . Abnormal weight loss   . GERD (gastroesophageal reflux disease)   . COPD (chronic obstructive pulmonary disease)   . Elevated antinuclear antibody (ANA) level   . Vitamin D deficiency   . Idiopathic peripheral neuropathy   . Cachexia   . Abdominal pain, chronic, generalized   . Constipation   . Vitamin B12 deficiency   . Right thyroid nodule   . Lymphadenopathy, inguinal   . Spleen enlarged   . Abdominal pain, RUQ   . Nephrocalcinosis   . Microscopic hematuria   . Hyperlipidemia           Medication List       This list is accurate as of: 12/15/14 11:16 AM.  Always use your most recent med list.               estradiol 0.025 mg/24hr patch  Commonly known as:  CLIMARA - Dosed in mg/24 hr  Place 0.025 mg onto the skin once a week. Uses a  .050 patch     estradiol 0.025 mg/24hr patch  Commonly known as:  CLIMARA  Place 1 patch (0.025 mg total) onto the skin once a week.     VITAMIN B-12 CR PO  Take 5,000 mcg by mouth. Dermal patch     Vitamin D3 Liqd  5,000 Units by Does not apply route daily.              Sexual History:   History  Sexual Activity  . Sexual Activity: Not Currently  Abuse/Trauma History: No childhood  trauma     No adult trauma   Psychiatric History:  Denies any inpatient      Outpatient - "Started in [place] - ended in [place] - in psychotherapy for  physical problems with no diagnosis. I also took medication, I had side affects from medication and the physical never got better. We mutually decided to end therapy. I do not  Feel like it benefit me really."   Strengths:   "I try to be a positive thinker and accept people without being judgements. I try to see the best in people."   Recovery Goals:  "I want to be better physically."  Hobbies/Interests:               "I read a lot, I do cryptology puzzles. I like old movies,  I am involved in genealogy."   Challenges/Barriers: "I don't know."    Family Med/Psych History:  Family History  Problem Relation Age of Onset  . Heart Problems Father   . Hyperlipidemia Father   . Heart disease Father   . Hypertension Father   . Multiple sclerosis Sister   . Heart Problems Brother   . Hypertension Brother   . Heart Problems Brother   . Hypertension Brother   . Hypertension Brother   . Hypertension Brother   . Hypertension Brother   . Cancer Maternal Aunt     Colon  . Schizophrenia Mother     Risk of Suicide/Violence: low Denies any current or past suicidal ideation  History of Suicide/Violence:  N/A  Psychosis:   N/A  Diagnosis:    Anxiety disorder, unspecified anxiety disorder type  Impression/DX:    NENE ARANAS  Is a 69 y.o.-year-old Caucasian Female who presents with an Anxiety Disorder. She reports a lot of stress and anxiety related to her 20 plus year progression of physical health issues. Her doctors have thus far been unable to find a medical reason for some of her physical symptoms and she was told that it is possible that some of her symptoms are caused by or exasperated by the stress and anxiety she experiences. Cattaleya has several factors that contribute to her stress and anxiety  levels such as,   separation from her daughter and grandchildren, inability to be independent, declining health, chronic pain, and unexplained symptoms. Coleta experiences a lot of worry about the unknown, about her multitude of medical issues, about her placement. She reports that she has the following symptoms: fatigue, restlessness,  difficulty concentrating, muscle tension, and unexplained pain.   Recommendation/Plan: Individual therapy 1x a week to become less frequent as symptoms decrease, and follow safety plan as needed

## 2014-12-18 DIAGNOSIS — M792 Neuralgia and neuritis, unspecified: Secondary | ICD-10-CM | POA: Diagnosis not present

## 2014-12-25 ENCOUNTER — Telehealth: Payer: Self-pay | Admitting: Internal Medicine

## 2014-12-25 DIAGNOSIS — G609 Hereditary and idiopathic neuropathy, unspecified: Secondary | ICD-10-CM

## 2014-12-25 NOTE — Telephone Encounter (Signed)
Advised pt that referral has been sent per dr Loann Quill note

## 2014-12-25 NOTE — Telephone Encounter (Signed)
Patient needs a referral to see a neurologist. Her foot doctor recommended her to see Dr. Estella Husk. They said she would need the referral from PCP.

## 2014-12-25 NOTE — Telephone Encounter (Signed)
Done

## 2014-12-30 ENCOUNTER — Ambulatory Visit (INDEPENDENT_AMBULATORY_CARE_PROVIDER_SITE_OTHER): Payer: Commercial Managed Care - HMO | Admitting: Internal Medicine

## 2014-12-30 ENCOUNTER — Encounter: Payer: Self-pay | Admitting: Internal Medicine

## 2014-12-30 VITALS — BP 100/58 | HR 89 | Temp 97.7°F | Resp 12 | Wt 106.8 lb

## 2014-12-30 DIAGNOSIS — E041 Nontoxic single thyroid nodule: Secondary | ICD-10-CM

## 2014-12-30 DIAGNOSIS — E039 Hypothyroidism, unspecified: Secondary | ICD-10-CM | POA: Diagnosis not present

## 2014-12-30 NOTE — Patient Instructions (Addendum)
Stay off Tirosint.  Please come back for labs in August.  Please come back for a follow-up appointment in 6 months.

## 2014-12-30 NOTE — Progress Notes (Signed)
[name], female   DOB: 10-13-1945, 69 y.o.   MRN: [id number]   HPI  Catherine Gregory is a 69 y.o.-year-old female, returning for f/u for mild hypothyroidism. Last visit   Reviewed hx: She returns after a hospitalization in [place], [place] - admitted 06/29/2014. She had flu-like sxs and then developed CP >> dx with PNA. Her TSH was found to be very high, at 17 >> started on LT4 50 mcg daily.  She could not tolerate the Levothyroxine >> switched to Synthroid DAW >> could not tolerate it >> switched to Tirosint >> could not tolerate it >> stopped.  She had the following sxs when taking the thyroid hh: - dysphagia - cough - hoarseness - stomach pain - sweating  I reviewed pt's thyroid tests - last TSH was off LT4: Lab Results  Component Value Date   TSH 3.52 12/05/2014   TSH 1.44 09/24/2014   TSH 0.87 08/11/2014   TSH 0.96 01/30/2014   TSH 4.10 10/10/2013   FREET4 0.86 12/05/2014   FREET4 1.02 09/24/2014   FREET4 1.22 08/11/2014   FREET4 0.92 01/30/2014  11.03.2015: TSH 17.06, fT4 1.24 (0.6-1.6), fT3 2.0    After stopping Tirosint, she feels much better: - + hoarseness better - + mm aches better - + weight gain, not since last visit (she is very happy about this and wants to gain more) - + still sweating - + neuropathy - will see a new neurologist - + more hair loss, not in clumps  She still c/o: - + fatigue - + heat intolerance/cold intolerance - + tremors - no palpitations - no anxiety/depression - + N/acid reflux  Thyroid U/S (12/15/2011): no thyroid nodules, possible acute vs chronic mild thyroiditis  She had testing for adrenal insufficiency in the past >> stimulation test normal, 3 years ago.  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   She also had swallowing problems, hoarseness.  Her B12 was low, at 292, as per check at her last hospitalization in [place]. She was on dermal patch of vitamin B12 supplementation, without level  improvement >> now 1000 mcg daily (s-l).  Patient has a history of aspiration PNA several times, has a hiatal hernia, has GERD. She had 4-5 EGDs.   She is seeing a Veterinary surgeon.  ROS: Constitutional: see HPI, had fever and chills recently, + nocturia Eyes: no blurry vision, no xerophthalmia ENT: no sore throat, no nodules palpated in throat, + dysphagia/no odynophagia, + hoarseness Cardiovascular: no CP/SOB/no palpitations/leg swelling Respiratory: + cough/no SOB/+ wheezing Gastrointestinal: + N/no V/D/C/+ heartburn  Musculoskeletal: + muscle/+ joint aches Skin: + hair loss Neurological: + fine tremors/numbness/tingling/dizziness, + HA  I reviewed pt's medications, allergies, PMH, social hx, family hx, and changes were documented in the history of present illness. Otherwise, unchanged from my initial visit note.  PE: BP 100/58 mmHg  Pulse 89  Temp(Src) 97.7 F (36.5 C) (Oral)  Resp 12  Wt 106 lb 12.8 oz (48.444 kg)  SpO2 95% Body mass index is 17.25 kg/(m^2). Wt Readings from Last 3 Encounters:  12/30/14 106 lb 12.8 oz (48.444 kg)  11/14/14 106 lb 11.2 oz (48.399 kg)  11/13/14 107 lb (48.535 kg)   Constitutional: underweight, appears weak and tremulous, soft voice, otherwise in NAD Eyes: PERRLA, EOMI, no exophthalmos ENT: moist mucous membranes, no thyromegaly, no thyroid nodule palpated; no cervical lymphadenopathy Cardiovascular: RRR, No MRG Respiratory: CTA B Gastrointestinal: abdomen soft, NT, ND, BS+ Musculoskeletal: no deformities, strength intact  in all 4;  Skin: moist, warm, no rashes Neurological: + fine tremor with outstretched hands, DTR normal in all 4  ASSESSMENT: 1. Hypothyroidism - recently started on levothyroxine, but has nausea and abdominal pain when taking  2. H/o thyroid nodul - thyroid U/S 2013: no thyroid nodule  PLAN: 1. Pt with mild hypothyroidism, with several symptoms, mostly fatigue and also dysesthesia in face and the rest of the body,  intolerant to thyroid hh. -  She was started on levothyroxine, but could not tolerate this because she had abdominal pain, nausea, dysphagia, and also weakness. We tried Synthroid brand name 50 mcg (no additives, no excipients) >> tried Tirosint but still had sxs again related to her facial dysesthesia and also joint pains. We had to stop Tirosint and stay off thyroid hh >> TFTs 1 mo ago reviewed with her: normal - we discussed about repeating TFTs in 3 months, but continue to stay off LT4 for now - I will see her back in 6 months  Orders Placed This Encounter  Procedures  . TSH  . T4, free  . T3, free   2. H/o tyroid nodule - not seen on latest neck U/s - no repeat U/s necessary

## 2015-01-01 DIAGNOSIS — M7750 Other enthesopathy of unspecified foot: Secondary | ICD-10-CM | POA: Diagnosis not present

## 2015-01-01 DIAGNOSIS — M792 Neuralgia and neuritis, unspecified: Secondary | ICD-10-CM | POA: Diagnosis not present

## 2015-01-01 DIAGNOSIS — G5752 Tarsal tunnel syndrome, left lower limb: Secondary | ICD-10-CM | POA: Diagnosis not present

## 2015-01-05 ENCOUNTER — Telehealth: Payer: Self-pay | Admitting: Internal Medicine

## 2015-01-05 NOTE — Telephone Encounter (Signed)
Spoke with pt and advised that according to ov note on 09/10/14, pt had no signs of emphysema on cxr or HRCT and no signs of COPD.  Pt verbalized understanding .  Nothing further needed.

## 2015-01-06 ENCOUNTER — Ambulatory Visit (HOSPITAL_COMMUNITY): Payer: Self-pay | Admitting: Clinical

## 2015-01-06 DIAGNOSIS — M5412 Radiculopathy, cervical region: Secondary | ICD-10-CM | POA: Diagnosis not present

## 2015-01-06 DIAGNOSIS — M542 Cervicalgia: Secondary | ICD-10-CM | POA: Diagnosis not present

## 2015-01-06 DIAGNOSIS — M5417 Radiculopathy, lumbosacral region: Secondary | ICD-10-CM | POA: Diagnosis not present

## 2015-01-06 DIAGNOSIS — G609 Hereditary and idiopathic neuropathy, unspecified: Secondary | ICD-10-CM | POA: Diagnosis not present

## 2015-01-06 DIAGNOSIS — G510 Bell's palsy: Secondary | ICD-10-CM | POA: Diagnosis not present

## 2015-01-06 DIAGNOSIS — G5601 Carpal tunnel syndrome, right upper limb: Secondary | ICD-10-CM | POA: Diagnosis not present

## 2015-01-06 DIAGNOSIS — G603 Idiopathic progressive neuropathy: Secondary | ICD-10-CM | POA: Diagnosis not present

## 2015-01-06 DIAGNOSIS — E559 Vitamin D deficiency, unspecified: Secondary | ICD-10-CM | POA: Diagnosis not present

## 2015-01-06 DIAGNOSIS — R202 Paresthesia of skin: Secondary | ICD-10-CM | POA: Diagnosis not present

## 2015-01-06 DIAGNOSIS — R270 Ataxia, unspecified: Secondary | ICD-10-CM | POA: Diagnosis not present

## 2015-01-06 DIAGNOSIS — R634 Abnormal weight loss: Secondary | ICD-10-CM | POA: Diagnosis not present

## 2015-01-20 ENCOUNTER — Ambulatory Visit (HOSPITAL_COMMUNITY): Payer: Self-pay | Admitting: Clinical

## 2015-01-27 DIAGNOSIS — M5417 Radiculopathy, lumbosacral region: Secondary | ICD-10-CM | POA: Diagnosis not present

## 2015-01-27 DIAGNOSIS — G603 Idiopathic progressive neuropathy: Secondary | ICD-10-CM | POA: Diagnosis not present

## 2015-01-27 DIAGNOSIS — G5601 Carpal tunnel syndrome, right upper limb: Secondary | ICD-10-CM | POA: Diagnosis not present

## 2015-01-27 DIAGNOSIS — M5412 Radiculopathy, cervical region: Secondary | ICD-10-CM | POA: Diagnosis not present

## 2015-01-27 DIAGNOSIS — M5481 Occipital neuralgia: Secondary | ICD-10-CM | POA: Diagnosis not present

## 2015-01-29 ENCOUNTER — Telehealth: Payer: Self-pay | Admitting: Internal Medicine

## 2015-01-29 NOTE — Telephone Encounter (Signed)
Patient ask you to call her as soon as you get a chance.

## 2015-01-29 NOTE — Telephone Encounter (Signed)
Returned pt's call. Pt stated that she is seeing a Neurologist at [facility], Dr Marguerita Merles. He has suggested that she go on Gabapentin for another problem she is having. She had Thyroid labs done 3 weeks ago with him and he said it is ok for her to take this medication. Pt wants Dr Charlean Sanfilippo opinion due to her hypothyroidism hx. Please advise.

## 2015-01-30 NOTE — Telephone Encounter (Signed)
Called pt and advised her per Dr Gherghe's message. Pt voiced understanding.  

## 2015-01-30 NOTE — Telephone Encounter (Signed)
No problem. She can take the gabapentin.

## 2015-06-02 IMAGING — CR DG CHEST 2V
2 series · 2 of 2 positions shown · non-contrast
Comparison: Chest x-ray 08/06/2014 and chest CT 08/14/2014

CLINICAL DATA: Abnormal chest x-ray.  Followup pneumonia.

EXAM:
CHEST  2 VIEW

[view not recorded (1 of 2)]
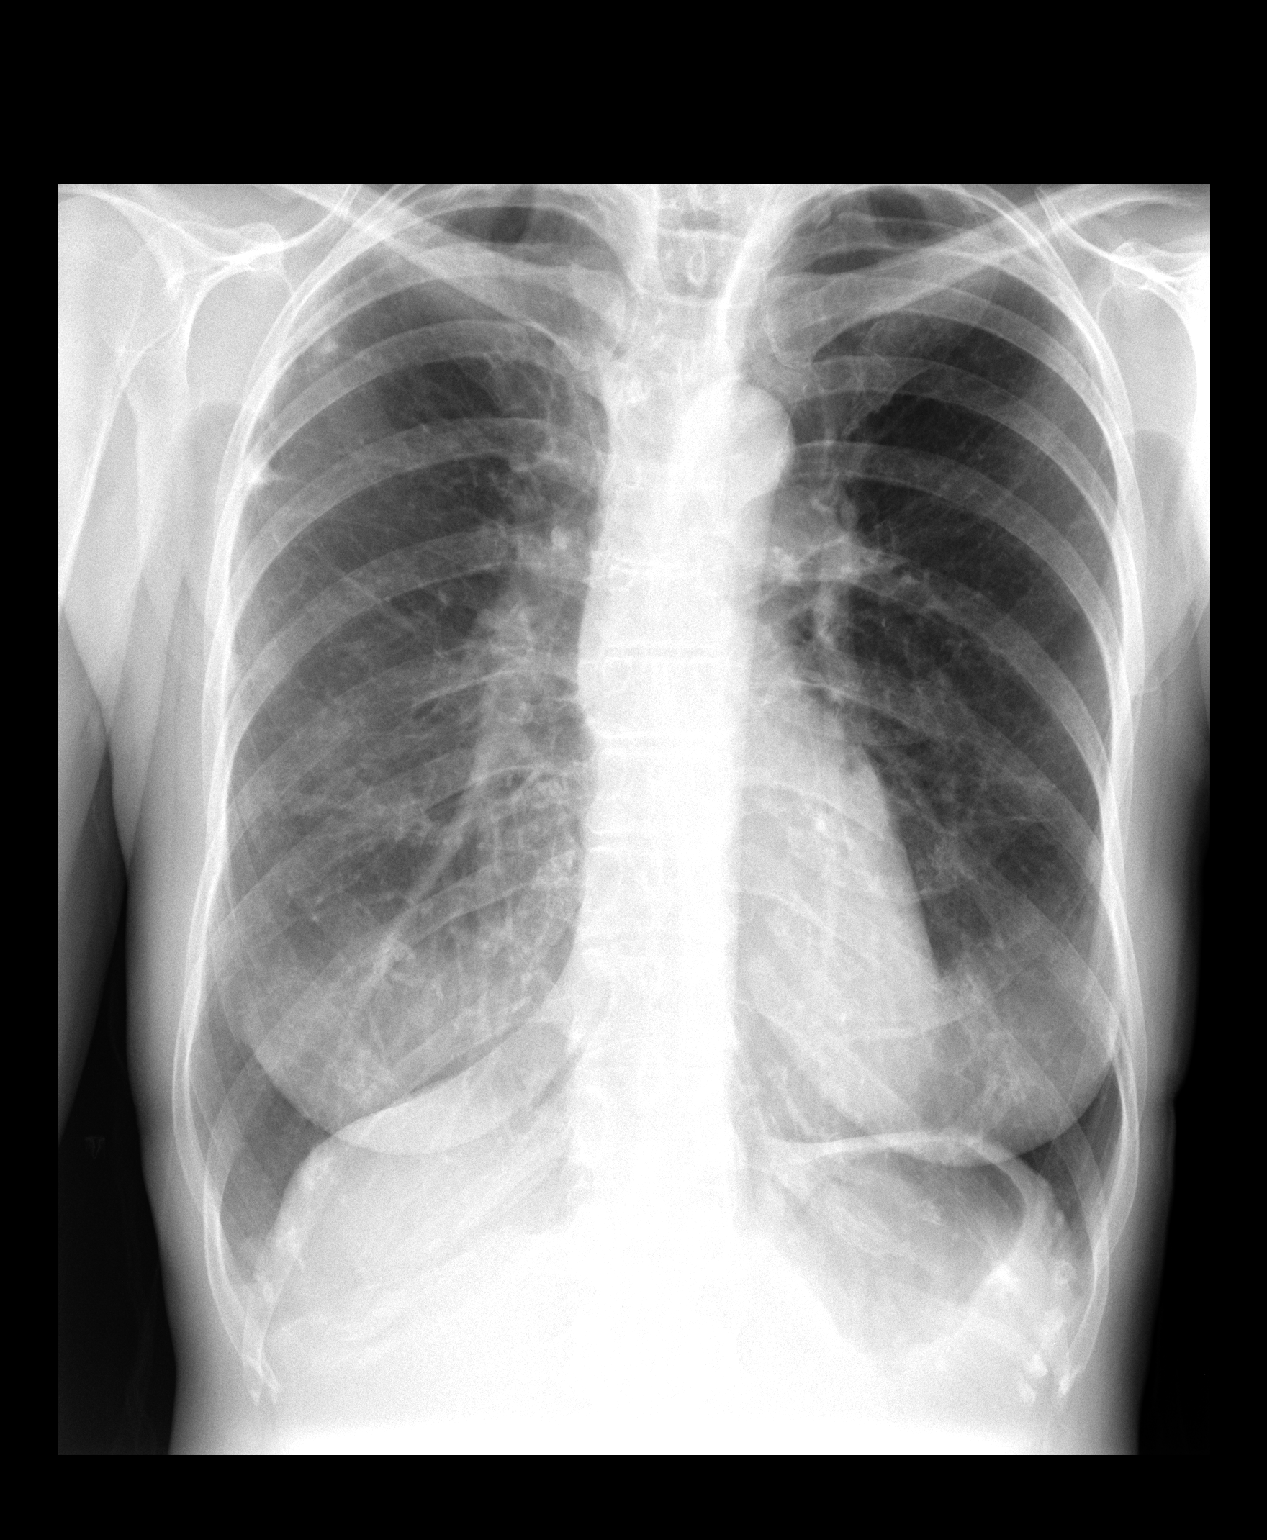

[view not recorded (2 of 2)]
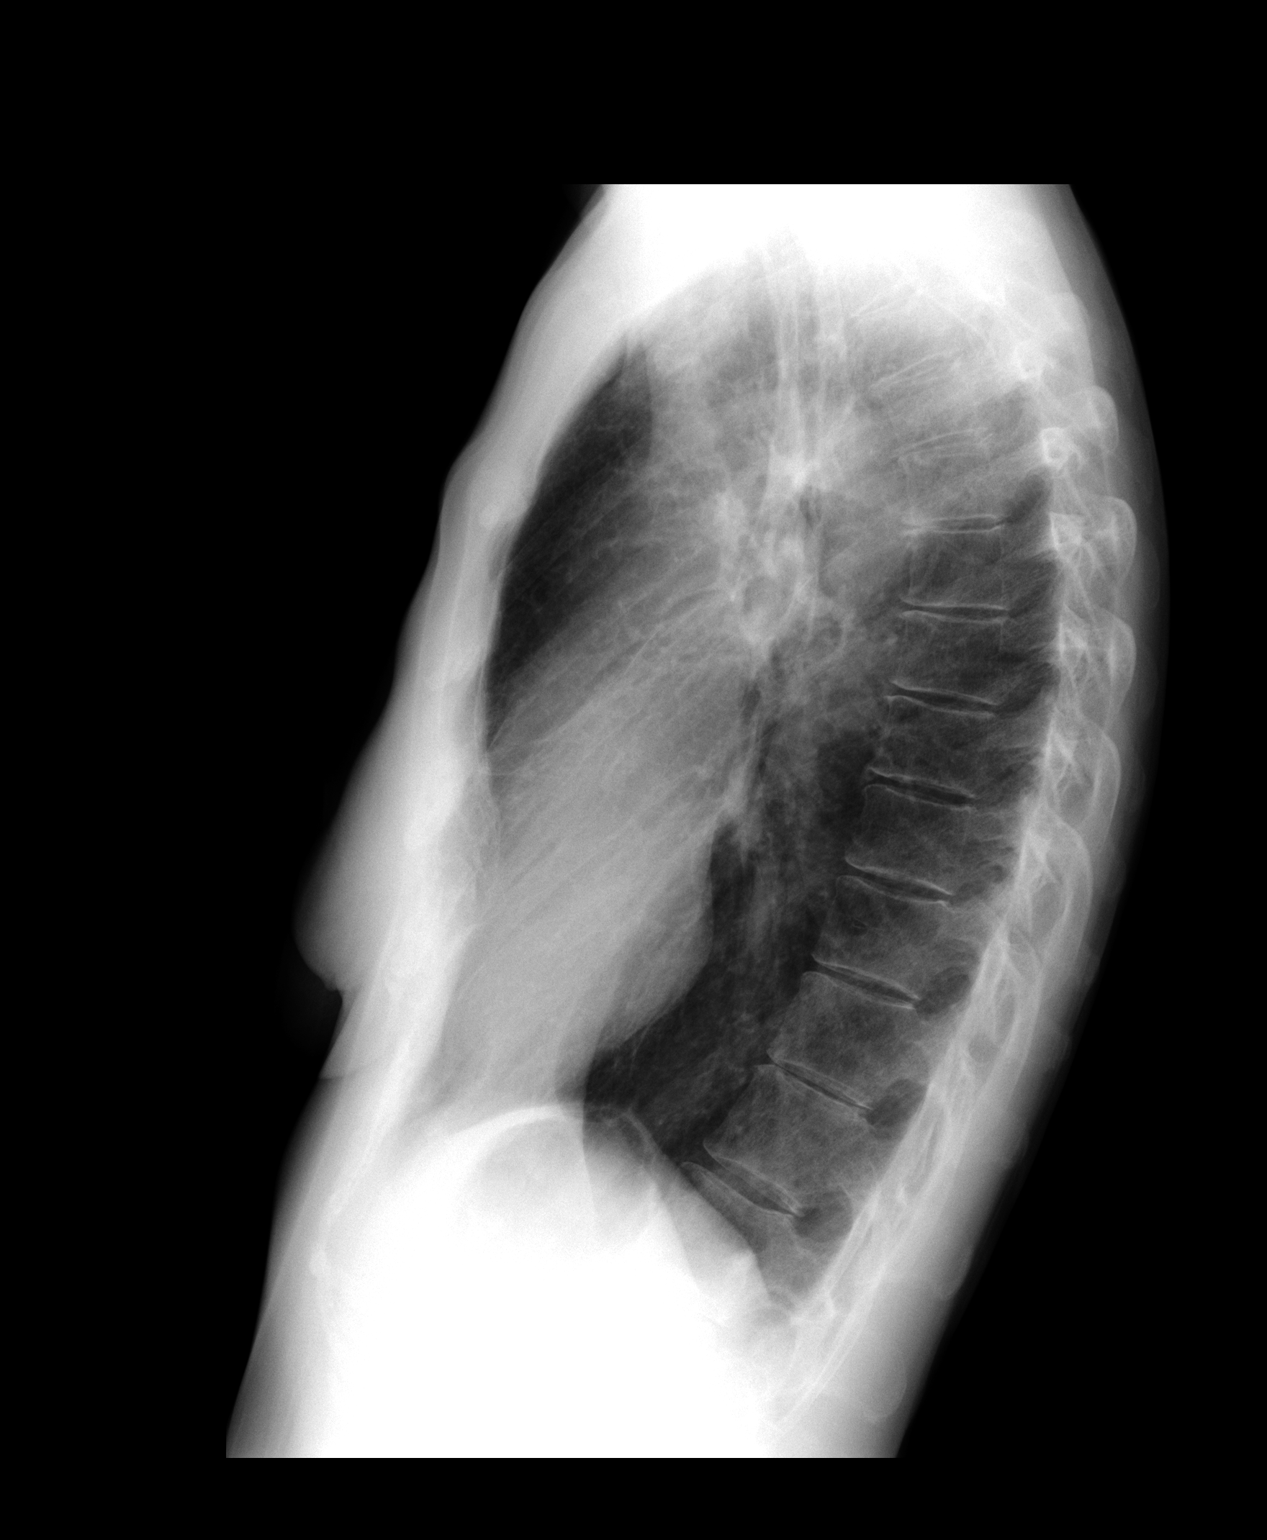

[2 of 2 positions shown; findings below may reference images not displayed]

FINDINGS: The cardiac silhouette, mediastinal and hilar contours are within
normal limits and stable. There are chronic emphysematous changes
and pulmonary scarring with stable right upper lobe densities. No
acute pulmonary infiltrates or pleural effusion. The bony thorax is
intact.
IMPRESSION: Chronic emphysematous changes and pulmonary scarring but no acute
overlying pulmonary process.

## 2015-06-11 ENCOUNTER — Telehealth: Payer: Self-pay

## 2015-06-11 NOTE — Telephone Encounter (Signed)
Will schedule AWV on 11/2 at 10:15 and will see DR. Crawford at [facility] as she is out of town until early Nov.

## 2015-07-01 ENCOUNTER — Encounter: Payer: Self-pay | Admitting: Internal Medicine

## 2015-07-01 ENCOUNTER — Ambulatory Visit (INDEPENDENT_AMBULATORY_CARE_PROVIDER_SITE_OTHER): Payer: Commercial Managed Care - HMO | Admitting: Internal Medicine

## 2015-07-01 VITALS — BP 120/70 | HR 106 | Temp 97.6°F | Ht 66.0 in | Wt 106.8 lb

## 2015-07-01 DIAGNOSIS — L989 Disorder of the skin and subcutaneous tissue, unspecified: Secondary | ICD-10-CM

## 2015-07-01 DIAGNOSIS — R5383 Other fatigue: Secondary | ICD-10-CM | POA: Diagnosis not present

## 2015-07-01 DIAGNOSIS — E039 Hypothyroidism, unspecified: Secondary | ICD-10-CM

## 2015-07-01 DIAGNOSIS — Z23 Encounter for immunization: Secondary | ICD-10-CM

## 2015-07-01 DIAGNOSIS — Z0000 Encounter for general adult medical examination without abnormal findings: Secondary | ICD-10-CM

## 2015-07-01 DIAGNOSIS — E538 Deficiency of other specified B group vitamins: Secondary | ICD-10-CM

## 2015-07-01 NOTE — Patient Instructions (Addendum)
Catherine Gregory , Thank you for taking time to come for your Medicare Wellness Visit. I appreciate your ongoing commitment to your health goals. Please review the following plan we discussed and let me know if I can assist you in the future.  Will start walking on treadmill; 30 min  Given flu shot today  These are the goals we discussed: Goals    . Exercise 150 minutes per week (moderate activity)     Will start walking 94min 2 to 3 times a week       This is a list of the screening recommended for you and due dates:  Health Maintenance  Topic Date Due  .  Hepatitis C: One time screening is recommended by Center for Disease Control  (CDC) for  adults born from [birth year] through 1966.   [birth year]  . Tetanus Vaccine  08/27/1964  . Pneumonia vaccines (1 of 2 - PCV13) 03/13/2011  . Flu Shot  03/30/2015  . Mammogram  12/01/2016  . Colon Cancer Screening  08/30/2020  . DEXA scan (bone density measurement)  Completed  . Shingles Vaccine  Addressed     Fall Prevention in the Home  Falls can cause injuries. They can happen to people of all ages. There are many things you can do to make your home safe and to help prevent falls.  WHAT CAN I DO ON THE OUTSIDE OF MY HOME?  Regularly fix the edges of walkways and driveways and fix any cracks.  Remove anything that might make you trip as you walk through a door, such as a raised step or threshold.  Trim any bushes or trees on the path to your home.  Use bright outdoor lighting.  Clear any walking paths of anything that might make someone trip, such as rocks or tools.  Regularly check to see if handrails are loose or broken. Make sure that both sides of any steps have handrails.  Any raised decks and porches should have guardrails on the edges.  Have any leaves, snow, or ice cleared regularly.  Use sand or salt on walking paths during winter.  Clean up any spills in your garage right away. This includes oil or grease spills. WHAT CAN I DO IN  THE BATHROOM?   Use night lights.  Install grab bars by the toilet and in the tub and shower. Do not use towel bars as grab bars.  Use non-skid mats or decals in the tub or shower.  If you need to sit down in the shower, use a plastic, non-slip stool.  Keep the floor dry. Clean up any water that spills on the floor as soon as it happens.  Remove soap buildup in the tub or shower regularly.  Attach bath mats securely with double-sided non-slip rug tape.  Do not have throw rugs and other things on the floor that can make you trip. WHAT CAN I DO IN THE BEDROOM?  Use night lights.  Make sure that you have a light by your bed that is easy to reach.  Do not use any sheets or blankets that are too big for your bed. They should not hang down onto the floor.  Have a firm chair that has side arms. You can use this for support while you get dressed.  Do not have throw rugs and other things on the floor that can make you trip. WHAT CAN I DO IN THE KITCHEN?  Clean up any spills right away.  Avoid walking on wet floors.  Keep items that you use a lot in easy-to-reach places.  If you need to reach something above you, use a strong step stool that has a grab bar.  Keep electrical cords out of the way.  Do not use floor polish or wax that makes floors slippery. If you must use wax, use non-skid floor wax.  Do not have throw rugs and other things on the floor that can make you trip. WHAT CAN I DO WITH MY STAIRS?  Do not leave any items on the stairs.  Make sure that there are handrails on both sides of the stairs and use them. Fix handrails that are broken or loose. Make sure that handrails are as long as the stairways.  Check any carpeting to make sure that it is firmly attached to the stairs. Fix any carpet that is loose or worn.  Avoid having throw rugs at the top or bottom of the stairs. If you do have throw rugs, attach them to the floor with carpet tape.  Make sure that you  have a light switch at the top of the stairs and the bottom of the stairs. If you do not have them, ask someone to add them for you. WHAT ELSE CAN I DO TO HELP PREVENT FALLS?  Wear shoes that:  Do not have high heels.  Have rubber bottoms.  Are comfortable and fit you well.  Are closed at the toe. Do not wear sandals.  If you use a stepladder:  Make sure that it is fully opened. Do not climb a closed stepladder.  Make sure that both sides of the stepladder are locked into place.  Ask someone to hold it for you, if possible.  Clearly mark and make sure that you can see:  Any grab bars or handrails.  First and last steps.  Where the edge of each step is.  Use tools that help you move around (mobility aids) if they are needed. These include:  Canes.  Walkers.  Scooters.  Crutches.  Turn on the lights when you go into a dark area. Replace any light bulbs as soon as they burn out.  Set up your furniture so you have a clear path. Avoid moving your furniture around.  If any of your floors are uneven, fix them.  If there are any pets around you, be aware of where they are.  Review your medicines with your doctor. Some medicines can make you feel dizzy. This can increase your chance of falling. Ask your doctor what other things that you can do to help prevent falls.   This information is not intended to replace advice given to you by your health care provider. Make sure you discuss any questions you have with your health care provider.   Document Released: 06/11/2009 Document Revised: 12/30/2014 Document Reviewed: 09/19/2014 Elsevier Interactive Patient Education 2016 [place] Maintenance, Female Adopting a healthy lifestyle and getting preventive care can go a long way to promote health and wellness. Talk with your health care provider about what schedule of regular examinations is right for you. This is a good chance for you to check in with your  provider about disease prevention and staying healthy. In between checkups, there are plenty of things you can do on your own. Experts have done a lot of research about which lifestyle changes and preventive measures are most likely to keep you healthy. Ask your health care provider for more information. WEIGHT AND DIET  Eat a healthy diet  Be sure to include plenty of vegetables, fruits, low-fat dairy products, and lean protein.  Do not eat a lot of foods high in solid fats, added sugars, or salt.  Get regular exercise. This is one of the most important things you can do for your health.  Most adults should exercise for at least 150 minutes each week. The exercise should increase your heart rate and make you sweat (moderate-intensity exercise).  Most adults should also do strengthening exercises at least twice a week. This is in addition to the moderate-intensity exercise.  Maintain a healthy weight  Body mass index (BMI) is a measurement that can be used to identify possible weight problems. It estimates body fat based on height and weight. Your health care provider can help determine your BMI and help you achieve or maintain a healthy weight.  For females 9 years of age and older:   A BMI below 18.5 is considered underweight.  A BMI of 18.5 to 24.9 is normal.  A BMI of 25 to 29.9 is considered overweight.  A BMI of 30 and above is considered obese.  Watch levels of cholesterol and blood lipids  You should start having your blood tested for lipids and cholesterol at 70 years of age, then have this test every 5 years.  You may need to have your cholesterol levels checked more often if:  Your lipid or cholesterol levels are high.  You are older than 70 years of age.  You are at high risk for heart disease.  CANCER SCREENING   Lung Cancer  Lung cancer screening is recommended for adults 7-16 years old who are at high risk for lung cancer because of a history of  smoking.  A yearly low-dose CT scan of the lungs is recommended for people who:  Currently smoke.  Have quit within the past 15 years.  Have at least a 30-pack-year history of smoking. A pack year is smoking an average of one pack of cigarettes a day for 1 year.  Yearly screening should continue until it has been 15 years since you quit.  Yearly screening should stop if you develop a health problem that would prevent you from having lung cancer treatment.  Breast Cancer  Practice breast self-awareness. This means understanding how your breasts normally appear and feel.  It also means doing regular breast self-exams. Let your health care provider know about any changes, no matter how small.  If you are in your 20s or 30s, you should have a clinical breast exam (CBE) by a health care provider every 1-3 years as part of a regular health exam.  If you are 57 or older, have a CBE every year. Also consider having a breast X-ray (mammogram) every year.  If you have a family history of breast cancer, talk to your health care provider about genetic screening.  If you are at high risk for breast cancer, talk to your health care provider about having an MRI and a mammogram every year.  Breast cancer gene (BRCA) assessment is recommended for women who have family members with BRCA-related cancers. BRCA-related cancers include:  Breast.  Ovarian.  Tubal.  Peritoneal cancers.  Results of the assessment will determine the need for genetic counseling and BRCA1 and BRCA2 testing. Cervical Cancer Your health care provider may recommend that you be screened regularly for cancer of the pelvic organs (ovaries, uterus, and vagina). This screening involves a pelvic examination, including checking for microscopic changes to the surface of your cervix (  Pap test). You may be encouraged to have this screening done every 3 years, beginning at age 21.  For women ages 30-65, health care providers may  recommend pelvic exams and Pap testing every 3 years, or they may recommend the Pap and pelvic exam, combined with testing for human papilloma virus (HPV), every 5 years. Some types of HPV increase your risk of cervical cancer. Testing for HPV may also be done on women of any age with unclear Pap test results.  Other health care providers may not recommend any screening for nonpregnant women who are considered low risk for pelvic cancer and who do not have symptoms. Ask your health care provider if a screening pelvic exam is right for you.  If you have had past treatment for cervical cancer or a condition that could lead to cancer, you need Pap tests and screening for cancer for at least 20 years after your treatment. If Pap tests have been discontinued, your risk factors (such as having a new sexual partner) need to be reassessed to determine if screening should resume. Some women have medical problems that increase the chance of getting cervical cancer. In these cases, your health care provider may recommend more frequent screening and Pap tests. Colorectal Cancer  This type of cancer can be detected and often prevented.  Routine colorectal cancer screening usually begins at 70 years of age and continues through 70 years of age.  Your health care provider may recommend screening at an earlier age if you have risk factors for colon cancer.  Your health care provider may also recommend using home test kits to check for hidden blood in the stool.  A small camera at the end of a tube can be used to examine your colon directly (sigmoidoscopy or colonoscopy). This is done to check for the earliest forms of colorectal cancer.  Routine screening usually begins at age 50.  Direct examination of the colon should be repeated every 5-10 years through 70 years of age. However, you may need to be screened more often if early forms of precancerous polyps or small growths are found. Skin Cancer  Check your  skin from head to toe regularly.  Tell your health care provider about any new moles or changes in moles, especially if there is a change in a mole's shape or color.  Also tell your health care provider if you have a mole that is larger than the size of a pencil eraser.  Always use sunscreen. Apply sunscreen liberally and repeatedly throughout the day.  Protect yourself by wearing long sleeves, pants, a wide-brimmed hat, and sunglasses whenever you are outside. HEART DISEASE, DIABETES, AND HIGH BLOOD PRESSURE   High blood pressure causes heart disease and increases the risk of stroke. High blood pressure is more likely to develop in:  People who have blood pressure in the high end of the normal range (130-139/85-89 mm Hg).  People who are overweight or obese.  People who are African American.  If you are 18-39 years of age, have your blood pressure checked every 3-5 years. If you are 40 years of age or older, have your blood pressure checked every year. You should have your blood pressure measured twice--once when you are at a hospital or clinic, and once when you are not at a hospital or clinic. Record the average of the two measurements. To check your blood pressure when you are not at a hospital or clinic, you can use:  An automated blood pressure   machine at a pharmacy.  A home blood pressure monitor.  If you are between 55 years and 79 years old, ask your health care provider if you should take aspirin to prevent strokes.  Have regular diabetes screenings. This involves taking a blood sample to check your fasting blood sugar level.  If you are at a normal weight and have a low risk for diabetes, have this test once every three years after 70 years of age.  If you are overweight and have a high risk for diabetes, consider being tested at a younger age or more often. PREVENTING INFECTION  Hepatitis B  If you have a higher risk for hepatitis B, you should be screened for this  virus. You are considered at high risk for hepatitis B if:  You were born in a country where hepatitis B is common. Ask your health care provider which countries are considered high risk.  Your parents were born in a high-risk country, and you have not been immunized against hepatitis B (hepatitis B vaccine).  You have HIV or AIDS.  You use needles to inject street drugs.  You live with someone who has hepatitis B.  You have had sex with someone who has hepatitis B.  You get hemodialysis treatment.  You take certain medicines for conditions, including cancer, organ transplantation, and autoimmune conditions. Hepatitis C  Blood testing is recommended for:  Everyone born from 1945 through 1965.  Anyone with known risk factors for hepatitis C. Sexually transmitted infections (STIs)  You should be screened for sexually transmitted infections (STIs) including gonorrhea and chlamydia if:  You are sexually active and are younger than 70 years of age.  You are older than 70 years of age and your health care provider tells you that you are at risk for this type of infection.  Your sexual activity has changed since you were last screened and you are at an increased risk for chlamydia or gonorrhea. Ask your health care provider if you are at risk.  If you do not have HIV, but are at risk, it may be recommended that you take a prescription medicine daily to prevent HIV infection. This is called pre-exposure prophylaxis (PrEP). You are considered at risk if:  You are sexually active and do not regularly use condoms or know the HIV status of your partner(s).  You take drugs by injection.  You are sexually active with a partner who has HIV. Talk with your health care provider about whether you are at high risk of being infected with HIV. If you choose to begin PrEP, you should first be tested for HIV. You should then be tested every 3 months for as long as you are taking PrEP.  PREGNANCY    If you are premenopausal and you may become pregnant, ask your health care provider about preconception counseling.  If you may become pregnant, take 400 to 800 micrograms (mcg) of folic acid every day.  If you want to prevent pregnancy, talk to your health care provider about birth control (contraception). OSTEOPOROSIS AND MENOPAUSE   Osteoporosis is a disease in which the bones lose minerals and strength with aging. This can result in serious bone fractures. Your risk for osteoporosis can be identified using a bone density scan.  If you are 65 years of age or older, or if you are at risk for osteoporosis and fractures, ask your health care provider if you should be screened.  Ask your health care provider whether you should take   a calcium or vitamin D supplement to lower your risk for osteoporosis.  Menopause may have certain physical symptoms and risks.  Hormone replacement therapy may reduce some of these symptoms and risks. Talk to your health care provider about whether hormone replacement therapy is right for you.  HOME CARE INSTRUCTIONS   Schedule regular health, dental, and eye exams.  Stay current with your immunizations.   Do not use any tobacco products including cigarettes, chewing tobacco, or electronic cigarettes.  If you are pregnant, do not drink alcohol.  If you are breastfeeding, limit how much and how often you drink alcohol.  Limit alcohol intake to no more than 1 drink per day for nonpregnant women. One drink equals 12 ounces of beer, 5 ounces of wine, or 1 ounces of hard liquor.  Do not use street drugs.  Do not share needles.  Ask your health care provider for help if you need support or information about quitting drugs.  Tell your health care provider if you often feel depressed.  Tell your health care provider if you have ever been abused or do not feel safe at home.   This information is not intended to replace advice given to you by your  health care provider. Make sure you discuss any questions you have with your health care provider.   Document Released: 02/28/2011 Document Revised: 09/05/2014 Document Reviewed: 07/17/2013 Elsevier Interactive Patient Education 2016 [employer]  

## 2015-07-01 NOTE — Progress Notes (Signed)
Subjective:   Catherine Gregory is a 69 y.o. female who presents for Medicare Annual (Subsequent) preventive examination.  Review of Systems: by Dr. Sharlet Salina at 11am  Cardiac Risk Factors include: advanced age (>72mn, >>50women);sedentary lifestyle   The Patient was informed that this wellness visit is to identify risk and educate on how to reduce risk for increase disease through lifestyle changes.   ROS deferred to CPE exam with physician  Describes health as fair; long standing issues with generalized neurpathy; never been dx; is in counseling at present.  Describes Peripheral neuropathy as tingling, numbness all over, even to her face; feels this all the time and hurts; keeps her awake; pain is brief Balance has gotten worse, [place]  Was told it was her "imagination" - therapist told her she is not sure why she is there. 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   Also Feels temp in her body is changing; gets very warm and then feels cool;   Has had bx of skin at both ankles; states she the small nerve fibers are not working; this was done by her podiatrist   Medical issues include CAP;  Hyperlipidemia HDL 93; Trig 48 10/2014   BMI: 17.1/  Was 160lbs until she had her hysterectmy Diet; states she has digestive problem (hiatal hernia)  States she can't take much medicine Healthy diet; eat breakfast; 2 to 3 eggs; green tea; piece of toast; bacon; sometimes have mushrooms and  avocado a few times a week Lunch; eats beans; don't' eat a lot of fruit except avocado and eats Brussel sprouts, celery and carrots; doesn't eat a lot of meat and including beef Eat fried occasionally; uses butter   Exercise; Admits to balance issues;  [place]; states because she has a big home and has 2 boys; 11 and 70yo Goes once a year but plans to perhaps move there at some future time Has to push herself to exercise because of her fatigue;  Goal; wants to walk; son has treadmill; Will attempt to walk casually x 30 minutes   SAFETY/ [place] Live with brother and sister in law; They live in apt; small townhouse;  She has a few steps in the home Discussed Balance exercises; has hx of tai chi; Given exercises for balance as well as NIH safety and fall prevention information.   Fall assessment; no  Mobilization and Functional losses in the last year. No; but was walking more outside last year/ Set goal to walk this year   Urinary or fecal incontinence reviewed; no issues verbalized  Counseling: hep c educated  Colonoscopy; 01/23/2014; has had several and probably will not have another EKG: no record Hearing: 4000 hz both ears Dexa 09/07/2011 spine -1.3; Left hip -2.0; right hip -2.1 Vit D level 11/13/2014 39.58 Mammagram: 12/02/2014 There are no findings suspicious for malignancy Ophthalmology exam; no problem; regular eye checks; last spring Monitoring cataracts and has high eye pressure; but no problem at this time Immunizations Due  Flu; will take today; declines high does but will take the quad flu shot Prevnar 13; not sure if she has taken; will discuss with dr. CSharlet Salina  Tetanus; has not had tetanus/ declines    Current Care Team reviewed and updated      Objective:     Vitals: BP 120/70 mmHg  Pulse 106  Temp(Src) 97.6 F (36.4 C) (Oral)  Ht _0  (1.676 m)  Wt 106 lb 12 oz (48.421 kg)  BMI 17.24 kg/m2  SpO2 98%  Tobacco History  Smoking status  . Never Smoker   Smokeless tobacco  . Never Used     Counseling given: Yes   Past Medical History  Diagnosis Date  . Disturbance of skin sensation 08/13/2013  . Myalgia and myositis, unspecified 08/13/2013  . Depression  with anxiety   . GERD (gastroesophageal reflux disease)   . Hiatal hernia   . Malnutrition ([place])   . Migraine   . Renal calculus   . Dysphagia, idiopathic   . Abnormal weight loss   . GERD (gastroesophageal reflux disease)   . COPD (chronic obstructive pulmonary disease) ([place])   . Elevated antinuclear antibody (ANA) level   . Vitamin D deficiency   . Idiopathic peripheral neuropathy (HCC)   . Cachexia ([place])   . Abdominal pain, chronic, generalized   . Constipation   . Vitamin B12 deficiency   . Right thyroid nodule   . Lymphadenopathy, inguinal   . Spleen enlarged   . Abdominal pain, RUQ   . Nephrocalcinosis   . Microscopic hematuria   . Hyperlipidemia    Past Surgical History  Procedure Laterality Date  . Cholecystectomy  1978  . Abdominal hysterectomy  1998  . Tubal ligation Bilateral 1988  . Dilation and curettage of uterus  1998  . Nasal septum surgery  1984  . Bx of skin at outer ankle Bilateral April or May of 2016   Family History  Problem Relation Age of Onset  . Heart Problems Father   . Hyperlipidemia Father   . Heart disease Father   . Hypertension Father   . Multiple sclerosis Sister   . Heart Problems Brother   . Hypertension Brother   . Heart Problems Brother   . Hypertension Brother   . Hypertension Brother   . Hypertension Brother   . Hypertension Brother   . Cancer Maternal Aunt     Colon  . Schizophrenia Mother    History  Sexual Activity  . Sexual Activity: Not Currently    Outpatient Encounter Prescriptions as of 07/01/2015  Medication Sig  . Cholecalciferol (VITAMIN D3) LIQD 8,000 Units by Does not apply route daily.   . Cyanocobalamin (VITAMIN B-12 CR PO) Take 5,000 mcg by mouth. Dermal patch  . estradiol (CLIMARA) 0.025 mg/24hr patch Place 1 patch (0.025 mg total) onto the skin once a week.  . [DISCONTINUED] estradiol (CLIMARA - DOSED IN MG/24 HR) 0.025 mg/24hr patch Place 0.025 mg onto the skin once a week. Uses a  .050 patch  .  vitamin C (ASCORBIC ACID) 500 MG tablet Take 500 mg by mouth daily.   No facility-administered encounter medications on file as of 07/01/2015.    Activities of Daily Living In your present state of health, do you have any difficulty performing the following activities: 07/01/2015  Hearing? N  Vision? N  Walking or climbing stairs? N  Dressing or bathing? N  Doing errands, shopping? N  Preparing Food and eating ? N  Using the Toilet? N  In the past six months, have you accidently leaked urine? N  Do you have problems with loss of bowel control? N  Managing your  Medications? N  Managing your Finances? N  Housekeeping or managing your Housekeeping? N    Patient Care Team: Hoyt Koch, MD as PCP - General (Internal Medicine) Kathrynn Ducking, MD as Consulting Physician (Neurology)    Assessment:     Assessment  68 yo presents well groomed and pleasant despite current health issues; Feels she is to thin and hates to look in the mirror.    Patient presents for yearly preventative medicine examination. Medicare questionnaire screening were completed, i.e. Functional; fall risk; depression, memory loss and hearing. No issues but did discuss memory and is concerned about short term recall on occasion; Will track over time but AD8 score was 1; Stated she has multiple chronic issues and focus may be a problem; will monitor over time.   All immunizations and health maintenance protocols were reviewed with the patient and needed orders were placed. Agreed to take flu vaccine; Education provided for laboratory screens regarding hep c  Medication reconciliation, past medical history, social history, problem list and allergies were reviewed in detail with the patient  Goals were established with regard to weight loss, exercise, and the patient agreed to start walking on the treadmill.   End of life planning was discussed and declined information   Exercise Activities and Dietary  recommendations Current Exercise Habits:: Home exercise routine, Type of exercise: walking, Time (Minutes): 30, Frequency (Times/Week): 3, Weekly Exercise (Minutes/Week): 90, Intensity: Mild  Goals    . Exercise 150 minutes per week (moderate activity)     Will start walking 33mn 2 to 3 times a week      Fall Risk Fall Risk  07/01/2015  Falls in the past year? No   Depression Screen PHQ 2/9 Scores 07/01/2015  PHQ - 2 Score 0     Cognitive Testing MMSE - Mini Mental State Exam 07/01/2015  Not completed: (No Data)   AD8 score 1; concerned about recall at times, but conversation was fluent and no difficulty noted.  Immunization History  Administered Date(s) Administered  . Influenza,inj,Quad PF,36+ Mos 07/31/2014, 07/01/2015  . Pneumococcal Polysaccharide-23 03/12/2010   Screening Tests Health Maintenance  Topic Date Due  . Hepatitis C Screening  112/09/1945 . TETANUS/TDAP  08/27/1964  . PNA vac Low Risk Adult (1 of 2 - PCV13) 03/13/2011  . INFLUENZA VACCINE  03/30/2015  . MAMMOGRAM  12/01/2016  . COLONOSCOPY  08/30/2020  . DEXA SCAN  Completed  . ZOSTAVAX  Addressed      Plan:   Took flu shot Will start walking;    During the course of the visit the patient was educated and counseled about the following appropriate screening and preventive services:   Vaccines to include Pneumoccal, Influenza, Hepatitis B, Td, Zostavax, HCV  Needs TD and Prevnar; will consider  Electrocardiogram; no record seen; [place] Cardi[place] Disease/ Heart healthy diet; no c/o' chlol good;   Colorectal cancer screening/ declines to ever have this again  Bone density screening/ osteopenic;   Diabetes screening; na/  Glaucoma screening/ positive and monitoring  Mammography/PAP/ completed   Nutrition counseling / discussed   Patient Instructions (the written plan) was given to the patient.   HWynetta Fines RN  07/01/2015

## 2015-07-02 ENCOUNTER — Ambulatory Visit: Payer: Self-pay | Admitting: Internal Medicine

## 2015-07-02 DIAGNOSIS — G5601 Carpal tunnel syndrome, right upper limb: Secondary | ICD-10-CM | POA: Diagnosis not present

## 2015-07-02 DIAGNOSIS — M5417 Radiculopathy, lumbosacral region: Secondary | ICD-10-CM | POA: Diagnosis not present

## 2015-07-02 DIAGNOSIS — G603 Idiopathic progressive neuropathy: Secondary | ICD-10-CM | POA: Diagnosis not present

## 2015-07-02 DIAGNOSIS — M5412 Radiculopathy, cervical region: Secondary | ICD-10-CM | POA: Diagnosis not present

## 2015-07-03 ENCOUNTER — Encounter: Payer: Self-pay | Admitting: Internal Medicine

## 2015-07-03 DIAGNOSIS — R531 Weakness: Secondary | ICD-10-CM | POA: Insufficient documentation

## 2015-07-03 DIAGNOSIS — L989 Disorder of the skin and subcutaneous tissue, unspecified: Secondary | ICD-10-CM | POA: Insufficient documentation

## 2015-07-03 DIAGNOSIS — R5383 Other fatigue: Secondary | ICD-10-CM | POA: Insufficient documentation

## 2015-07-03 NOTE — Assessment & Plan Note (Signed)
Checking B12, vitamin D, cortisol (morning), CMP, CBC,

## 2015-07-03 NOTE — Progress Notes (Signed)
   Subjective:    [name], female    DOB: 1945/12/28, 69 y.o.   MRN: [id number]  HPI The patient is a 69 YO female coming in for a visit to talk about her fatigue. She has been struggling with it for some time. Has not had any vitamin levels checked recently and she wants to see if she needs any replacement. She does have tiredness all day, feels rested in the morning and sleeping okay. Some depression but no new symptoms and does not feel it affects her. Still gets pleasure with activities, has good support network. No weight change, fevers, chills. She has seen a new specialist for her nerve dysfunction at [facility] recently and is undergoing some testing.   Review of Systems  Constitutional: Positive for fatigue. Negative for fever, activity change, appetite change and unexpected weight change.  HENT: Negative.   Eyes: Negative.   Respiratory: Negative for cough, chest tightness, shortness of breath and wheezing.   Cardiovascular: Negative for chest pain, palpitations and leg swelling.  Gastrointestinal: Negative for nausea, abdominal pain, diarrhea, constipation and abdominal distention.  Musculoskeletal: Positive for gait problem. Negative for arthralgias.  Skin: Negative.   Neurological: Positive for weakness and numbness. Negative for dizziness, syncope and light-headedness.  Psychiatric/Behavioral: Negative.       Objective:   Physical Exam  Constitutional: She is oriented to person, place, and time. She appears well-developed and well-nourished.  HENT:  Head: Normocephalic and atraumatic.  Eyes: EOM are normal.  Neck: Normal range of motion.  Cardiovascular: Normal rate and regular rhythm.   Pulmonary/Chest: Effort normal and breath sounds normal. No respiratory distress. She has no wheezes. She has no rales.  Abdominal: Soft. Bowel sounds are normal. She exhibits no distension. There is no tenderness.  Neurological: She is alert and oriented to person, place,  and time. A cranial nerve deficit is present.  Changes in sensation  Skin: Skin is warm and dry.   Filed Vitals:   07/01/15 1031  BP: 120/70  Pulse: 106  Temp: 97.6 F (36.4 C)  TempSrc: Oral  Height:  (1.676 m)  Weight: 106 lb 12 oz (48.421 kg)  SpO2: 98%      Assessment & Plan:  Flu shot given at visit.

## 2015-07-03 NOTE — Assessment & Plan Note (Signed)
Several moles and refer to dermatology for full skin exam.

## 2015-07-03 NOTE — Progress Notes (Signed)
Medical screening examination/treatment/procedure(s) were performed by non-physician practitioner and as supervising physician I was immediately available for consultation/collaboration. I agree with above. Elizabeth A Crawford, MD 

## 2015-07-03 NOTE — Assessment & Plan Note (Signed)
Checking B12 level today.  

## 2015-07-03 NOTE — Assessment & Plan Note (Signed)
Checking TSH and free T4.  

## 2015-07-06 ENCOUNTER — Other Ambulatory Visit (INDEPENDENT_AMBULATORY_CARE_PROVIDER_SITE_OTHER): Payer: Commercial Managed Care - HMO

## 2015-07-06 DIAGNOSIS — E538 Deficiency of other specified B group vitamins: Secondary | ICD-10-CM

## 2015-07-06 DIAGNOSIS — L989 Disorder of the skin and subcutaneous tissue, unspecified: Secondary | ICD-10-CM

## 2015-07-06 DIAGNOSIS — R5383 Other fatigue: Secondary | ICD-10-CM

## 2015-07-06 LAB — LIPID PANEL
Cholesterol: 203 mg/dL — ABNORMAL HIGH (ref 0–200)
HDL: 92 mg/dL (ref >39.00)
LDL Cholesterol: 98 mg/dL (ref 0–99)
NONHDL: 111.09
Total CHOL/HDL Ratio: 2
Triglycerides: 66 mg/dL (ref 0.0–149.0)
VLDL: 13.2 mg/dL (ref 0.0–40.0)

## 2015-07-06 LAB — VITAMIN D 25 HYDROXY (VIT D DEFICIENCY, FRACTURES): VITD: 35.43 ng/mL (ref 30.00–100.00)

## 2015-07-06 LAB — TSH: TSH: 6.08 u[IU]/mL — ABNORMAL HIGH (ref 0.35–4.50)

## 2015-07-06 LAB — VITAMIN B12: VITAMIN B 12: 786 pg/mL (ref 211–911)

## 2015-07-06 LAB — CORTISOL: Cortisol, Plasma: 16.7 ug/dL

## 2015-07-06 LAB — FERRITIN: Ferritin: 52.2 ng/mL (ref 10.0–291.0)

## 2015-07-06 LAB — T4, FREE: FREE T4: 1.12 ng/dL (ref 0.60–1.60)

## 2015-07-15 ENCOUNTER — Telehealth: Payer: Self-pay | Admitting: Internal Medicine

## 2015-07-15 NOTE — Telephone Encounter (Signed)
Please follow up with in regards to referral to dermatologist.

## 2015-07-29 DIAGNOSIS — Z1283 Encounter for screening for malignant neoplasm of skin: Secondary | ICD-10-CM | POA: Diagnosis not present

## 2015-07-29 DIAGNOSIS — L650 Telogen effluvium: Secondary | ICD-10-CM | POA: Diagnosis not present

## 2015-07-30 DIAGNOSIS — L659 Nonscarring hair loss, unspecified: Secondary | ICD-10-CM | POA: Diagnosis not present

## 2015-08-07 ENCOUNTER — Telehealth: Payer: Self-pay | Admitting: Internal Medicine

## 2015-08-07 NOTE — Telephone Encounter (Signed)
Called and spoke with pt Pt was wanting to know when she should f/u with MW Informed pt that per last OV note pt should follow up PRN Pt states that her dermatologist wanted to her to f/u again with Dr Sherene Sires in regards to a new dx Pt could not remember the name of the dx but stated she would bring info at visit.  Pt scheduled for f/u appt with MW in Jan 2017  Nothing further is needed at this time.

## 2015-09-08 ENCOUNTER — Ambulatory Visit (INDEPENDENT_AMBULATORY_CARE_PROVIDER_SITE_OTHER)
Admission: RE | Admit: 2015-09-08 | Discharge: 2015-09-08 | Disposition: A | Discharge status: Home / Self Care | Payer: Commercial Managed Care - HMO | Source: Ambulatory Visit | Attending: Internal Medicine | Admitting: Internal Medicine

## 2015-09-08 ENCOUNTER — Ambulatory Visit (INDEPENDENT_AMBULATORY_CARE_PROVIDER_SITE_OTHER): Payer: Commercial Managed Care - HMO | Admitting: Internal Medicine

## 2015-09-08 ENCOUNTER — Encounter: Payer: Self-pay | Admitting: Internal Medicine

## 2015-09-08 VITALS — BP 102/74 | HR 100 | Ht 66.0 in | Wt 109.6 lb

## 2015-09-08 DIAGNOSIS — R490 Dysphonia: Secondary | ICD-10-CM | POA: Diagnosis not present

## 2015-09-08 DIAGNOSIS — J438 Other emphysema: Secondary | ICD-10-CM

## 2015-09-08 DIAGNOSIS — R1310 Dysphagia, unspecified: Secondary | ICD-10-CM

## 2015-09-08 DIAGNOSIS — R05 Cough: Secondary | ICD-10-CM | POA: Diagnosis not present

## 2015-09-08 NOTE — Patient Instructions (Addendum)
Please remember to go to the xray  department downstairs for your tests - we will call you with the results when they are available.  Please see patient coordinator before you leave today  to schedule DgEsophagram  Please schedule a follow up office visit in 6 weeks, call sooner if needed with pfts

## 2015-09-08 NOTE — Progress Notes (Signed)
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 Pulmonary Consult    Referred by Dr. Sanda Linger. Pt states was dxed with PNA on 06/29/14 while in [place]. She was hospitalized for 7 days. She states that she had been having SOB, cough and CP but she feels much improved now.   "my cxr always shows emphysema" but not pt denies any baseline doe and though was sob when acutely ill in [place] is back to baseline now and Not limited by breathing from desired activities  rec Try prilosec   Take 30-60 min before first meal of the day and Pepcid 20 mg one bedtime   GERD diet   08/06/2014 f/u ov/Rawn Quiroa re: s/p pna  Chief Complaint  Patient presents with  . Follow-up    CXR done today. breathing same, not much problem with breathing from PNA; no complaints  still coughing   But non productive, able to sleep s am exac  rec Try prilosec   Take 30-60 min before first meal of the day and Pepcid 20 mg one bedtime until  Return  GERD diet   09/10/2014 f/u ov/Braxxton Stoudt re: cough p ? Pna/ ? BOOP Chief Complaint  Patient presents with  . Follow-up    PFT done today. Pt states her breathing is overall doing well. She states that her cough has slightly improved, but hoarsness is worse.   rec  GERD diet  You will need to see an ENT doctor  next as I don't think the thyroid medication is what is causing your throat pain> did not go      09/08/2015  f/u ov/Adah Stoneberg re: ? Scleroderma  Chief Complaint  Patient presents with  . 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 She denies any new co's today.   Not limited by breathing from desired activities  But by balance Hoarseness waxex and wanes/ no ent x 10y with prev dx vcd at [facility] Swallowing def worse x one year    No obvious day to day or daytime variabilty or assoc sob or cp or chest tightness, subjective wheeze overt sinus or hb symptoms. No unusual exp hx or h/o childhood pna/ asthma or knowledge of premature birth.  Sleeping ok without nocturnal  or early am exacerbation  of respiratory  c/o's or need for noct saba. Also denies any obvious fluctuation of symptoms with weather or environmental changes or other aggravating or alleviating factors except as outlined above   Current Medications, Allergies, Complete Past Medical History, Past Surgical History, Family History, and Social History were reviewed in [employer] record.  ROS  The following  are not active complaints unless bolded sore throat, dysphagia, dental problems, itching, sneezing,  nasal congestion or excess/ purulent secretions, ear ache,   fever, chills, sweats, unintended wt loss, pleuritic or exertional cp, hemoptysis,  orthopnea pnd or leg swelling, presyncope, palpitations, heartburn, abdominal pain, anorexia, nausea, vomiting, diarrhea  or change in bowel or urinary habits, change in stools or urine, dysuria,hematuria,  rash, arthralgias, visual complaints, headache, numbness weakness or ataxia or problems with walking or coordination,  change in mood/affect or memory.                  Objective:   Physical Exam  amb hoarse wf nad   08/06/2014       100  >  09/10/2014 104 > 09/08/2015   110    07/17/14 99  lb 9.6 oz (45.178 kg)  07/14/14 96 lb 12 oz (43.886 kg)  07/14/14 96 lb 3.2 oz (43.636 kg)    Vital signs reviewed   HEENT: nl dentition, turbinates, and orophanx. Nl external ear canals without cough reflex   NECK :  without JVD/Nodes/TM/ nl carotid upstrokes bilaterally   LUNGS: no acc muscle use, clear to A and P bilaterally without cough on insp or exp maneuvers   CV:  RRR  no s3 or murmur or increase in P2, no edema   ABD:  soft and nontender with nl excursion in the supine position. No bruits or organomegaly, bowel sounds nl  MS:  warm without deformities, calf tenderness, cyanosis or clubbing  SKIN: warm and dry without lesions         CXR PA and Lateral:   09/08/2015 :    I personally reviewed images and agree with radiology impression as follows:    Right apical pleural parenchymal thickening noted consistent with scarring . No interim change. No acute abnormality    Labs   reviewed include:     Lab Results  Component Value Date   ESRSEDRATE 21 09/10/2014   ESRSEDRATE 50* 07/17/2014    Lab Results  Component Value Date   WBC 6.4 09/10/2014   HGB 14.1 09/10/2014   HCT 42.6 09/10/2014   MCV 95.3 09/10/2014   PLT 231.0 09/10/2014  No eos      Assessment & Plan:

## 2015-09-13 NOTE — Assessment & Plan Note (Signed)
No pulmonary evidence to suggest PAH/ ILD but given derm findings and dysphagia need to eval swallowing > DgEsophagram next step and return p that for full pfts

## 2015-09-13 NOTE — Assessment & Plan Note (Signed)
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

## 2015-09-13 NOTE — Assessment & Plan Note (Signed)
eval for vcd [facility] early 2000s > rec ent f/u 09/10/14 and max gerd rx > did not f/u as rec   Needs repeat pfts with f/v loop to sort out source of her problems

## 2015-09-18 ENCOUNTER — Ambulatory Visit (HOSPITAL_COMMUNITY)
Admission: RE | Admit: 2015-09-18 | Discharge: 2015-09-18 | Disposition: A | Discharge status: Home / Self Care | Payer: Commercial Managed Care - HMO | Source: Ambulatory Visit | Attending: Internal Medicine | Admitting: Internal Medicine

## 2015-09-18 DIAGNOSIS — K224 Dyskinesia of esophagus: Secondary | ICD-10-CM | POA: Insufficient documentation

## 2015-09-18 DIAGNOSIS — R05 Cough: Secondary | ICD-10-CM | POA: Insufficient documentation

## 2015-09-18 DIAGNOSIS — R1310 Dysphagia, unspecified: Secondary | ICD-10-CM | POA: Diagnosis not present

## 2015-09-18 DIAGNOSIS — J690 Pneumonitis due to inhalation of food and vomit: Secondary | ICD-10-CM | POA: Diagnosis not present

## 2015-09-21 ENCOUNTER — Other Ambulatory Visit: Payer: Self-pay | Admitting: Internal Medicine

## 2015-09-21 DIAGNOSIS — R1310 Dysphagia, unspecified: Secondary | ICD-10-CM

## 2015-09-21 NOTE — Progress Notes (Signed)
Quick Note:  Spoke with pt and notified of results per Dr. Wert. Pt verbalized understanding and denied any questions.  ______ 

## 2015-10-06 DIAGNOSIS — M5412 Radiculopathy, cervical region: Secondary | ICD-10-CM | POA: Diagnosis not present

## 2015-10-06 DIAGNOSIS — G603 Idiopathic progressive neuropathy: Secondary | ICD-10-CM | POA: Diagnosis not present

## 2015-10-06 DIAGNOSIS — M5417 Radiculopathy, lumbosacral region: Secondary | ICD-10-CM | POA: Diagnosis not present

## 2015-10-06 DIAGNOSIS — G5601 Carpal tunnel syndrome, right upper limb: Secondary | ICD-10-CM | POA: Diagnosis not present

## 2015-10-07 ENCOUNTER — Ambulatory Visit (INDEPENDENT_AMBULATORY_CARE_PROVIDER_SITE_OTHER): Payer: Commercial Managed Care - HMO | Admitting: Student

## 2015-10-07 ENCOUNTER — Encounter: Payer: Self-pay | Admitting: Student

## 2015-10-07 VITALS — BP 155/90 | HR 113 | Temp 98.4°F | Ht 66.0 in | Wt 109.3 lb

## 2015-10-07 DIAGNOSIS — R102 Pelvic and perineal pain: Secondary | ICD-10-CM | POA: Diagnosis not present

## 2015-10-07 NOTE — Patient Instructions (Signed)
Neuropathic Pain Neuropathic pain is pain caused by damage to the nerves that are responsible for certain sensations in your body (sensory nerves). The pain can be caused by damage to:   The sensory nerves that send signals to your spinal cord and brain (peripheral nervous system).  The sensory nerves in your brain or spinal cord (central nervous system). Neuropathic pain can make you more sensitive to pain. What would be a minor sensation for most people may feel very painful if you have neuropathic pain. This is usually a long-term condition that can be difficult to treat. The type of pain can differ from person to person. It may start suddenly (acute), or it may develop slowly and last for a long time (chronic). Neuropathic pain may come and go as damaged nerves heal or may stay at the same level for years. It often causes emotional distress, loss of sleep, and a lower quality of life. CAUSES  The most common cause of damage to a sensory nerve is diabetes. Many other diseases and conditions can also cause neuropathic pain. Causes of neuropathic pain can be classified as:  Toxic. Many drugs and chemicals can cause toxic damage. The most common cause of toxic neuropathic pain is damage from drug treatment for cancer (chemotherapy).  Metabolic. This type of pain can happen when a disease causes imbalances that damage nerves. Diabetes is the most common of these diseases. Vitamin B deficiency caused by long-term alcohol abuse is another common cause.  Traumatic. Any injury that cuts, crushes, or stretches a nerve can cause damage and pain. A common example is feeling pain after losing an arm or leg (phantom limb pain).  Compression-related. If a sensory nerve gets trapped or compressed for a long period of time, the blood supply to the nerve can be cut off.  Vascular. Many blood vessel diseases can cause neuropathic pain by decreasing blood supply and oxygen to nerves.  Autoimmune. This type of  pain results from diseases in which the body's defense system mistakenly attacks sensory nerves. Examples of autoimmune diseases that can cause neuropathic pain include lupus and multiple sclerosis.  Infectious. Many types of viral infections can damage sensory nerves and cause pain. Shingles infection is a common cause of this type of pain.  Inherited. Neuropathic pain can be a symptom of many diseases that are passed down through families (genetic). SIGNS AND SYMPTOMS  The main symptom is pain. Neuropathic pain is often described as:  Burning.  Shock-like.  Stinging.  Hot or cold.  Itching. DIAGNOSIS  No single test can diagnose neuropathic pain. Your health care provider will do a physical exam and ask you about your pain. You may use a pain scale to describe how bad your pain is. You may also have tests to see if you have a high sensitivity to pain and to help find the cause and location of any sensory nerve damage. These tests may include:  Imaging studies, such as:  X-rays.  CT scan.  MRI.  Nerve conduction studies to test how well nerve signals travel through your sensory nerves (electrodiagnostic testing).  Stimulating your sensory nerves through electrodes on your skin and measuring the response in your spinal cord and brain (somatosensory evoked potentials). TREATMENT  Treatment for neuropathic pain may change over time. You may need to try different treatment options or a combination of treatments. Some options include:  Over-the-counter pain relievers.  Prescription medicines. Some medicines used to treat other conditions may also help neuropathic pain. These   include medicines to:  Control seizures (anticonvulsants).  Relieve depression (antidepressants).  Prescription-strength pain relievers (narcotics). These are usually used when other pain relievers do not help.  Transcutaneous nerve stimulation (TENS). This uses electrical currents to block painful nerve  signals. The treatment is painless.  Topical and local anesthetics. These are medicines that numb the nerves. They can be injected as a nerve block or applied to the skin.  Alternative treatments, such as:  Acupuncture.  Meditation.  Massage.  Physical therapy.  Pain management programs.  Counseling. HOME CARE INSTRUCTIONS  Learn as much as you can about your condition.  Take medicines only as directed by your health care provider.  Work closely with all your health care providers to find what works best for you.  Have a good support system at home.  Consider joining a chronic pain support group. SEEK MEDICAL CARE IF:  Your pain treatments are not helping.  You are having side effects from your medicines.  You are struggling with fatigue, mood changes, depression, or anxiety.   This information is not intended to replace advice given to you by your health care provider. Make sure you discuss any questions you have with your health care provider.   Document Released: 05/12/2004 Document Revised: 09/05/2014 Document Reviewed: 01/23/2014 Elsevier Interactive Patient Education 2016 [employer] Inc.  

## 2015-10-07 NOTE — Progress Notes (Signed)
   Subjective:    [name], female    DOB: 09/13/1945, 70 y.o.   MRN: [id number]  HPI ELASHA WINGATE is a 70 y.o. female who presents with vaginal pain. Symptoms started 2 months ago. Reports spot on left labia that has intermittent tingling & numbness. Pt has history of neuropathy that she is seeing neurologist for in [facility]. States the pain in her vagina is just like her neuropathic pain but wanted to make sure there was "nothing else there".  Last saw her neurologist yesterday but didn't mention the new areas of neuropathy that include her vagina, buttocks, & ankle. Has been offered gabapentin several times by her neuro but doesn't want to take it.   Review of Systems  Constitutional: Negative.   Gastrointestinal: Negative for abdominal pain, anal bleeding and rectal pain.  Genitourinary: Positive for vaginal pain. Negative for dysuria, frequency, vaginal bleeding, vaginal discharge, genital sores and pelvic pain.      Objective:   Physical Exam  Constitutional: She appears well-developed. No distress.  Abdominal: Soft. There is no tenderness.  Genitourinary: Pelvic exam was performed with patient supine. There is no rash, tenderness, lesion or injury on the right labia. There is no rash, tenderness, lesion or injury on the left labia.  Skin: She is not diaphoretic.  Psychiatric: She has a normal mood and affect. Her behavior is normal. Judgment and thought content normal.   BP 155/90 mmHg  Pulse 113  Temp(Src) 98.4 F (36.9 C) (Oral)  Ht 5\' 6"  (1.676 m)  Wt 109 lb 4.8 oz (49.578 kg)  BMI 17.65 kg/m2      Assessment & Plan:   1. Vaginal pain      Normal age-appropriate female exam. Encouraged patient to discuss new symptoms with neurologist & consider gabapentin.  Return in 2 months for yearly exam.   Judeth Horn, NP

## 2015-10-19 ENCOUNTER — Other Ambulatory Visit: Payer: Self-pay | Admitting: *Deleted

## 2015-10-19 DIAGNOSIS — R0600 Dyspnea, unspecified: Secondary | ICD-10-CM

## 2015-10-20 ENCOUNTER — Ambulatory Visit (INDEPENDENT_AMBULATORY_CARE_PROVIDER_SITE_OTHER): Payer: Commercial Managed Care - HMO | Admitting: Internal Medicine

## 2015-10-20 ENCOUNTER — Encounter: Payer: Self-pay | Admitting: Internal Medicine

## 2015-10-20 VITALS — BP 104/68 | HR 96 | Ht 66.0 in | Wt 109.0 lb

## 2015-10-20 DIAGNOSIS — R1310 Dysphagia, unspecified: Secondary | ICD-10-CM | POA: Diagnosis not present

## 2015-10-20 DIAGNOSIS — R0600 Dyspnea, unspecified: Secondary | ICD-10-CM

## 2015-10-20 DIAGNOSIS — J438 Other emphysema: Secondary | ICD-10-CM | POA: Diagnosis not present

## 2015-10-20 DIAGNOSIS — R490 Dysphonia: Secondary | ICD-10-CM

## 2015-10-20 LAB — PULMONARY FUNCTION TEST
DL/VA % pred: 102 %
DL/VA: 5.15 ml/min/mmHg/L
DLCO COR % PRED: 77 %
DLCO cor: 20.92 ml/min/mmHg
DLCO unc % pred: 73 %
DLCO unc: 19.74 ml/min/mmHg
FEF 25-75 PRE: 1.19 L/s
FEF2575-%PRED-PRE: 59 %
FEV1-%PRED-PRE: 72 %
FEV1-PRE: 1.77 L
FEV1FVC-%PRED-PRE: 93 %
FEV6-%Pred-Pre: 80 %
FEV6-Pre: 2.49 L
FEV6FVC-%PRED-PRE: 103 %
FVC-%PRED-PRE: 77 %
FVC-Pre: 2.5 L
Pre FEV1/FVC ratio: 71 %
Pre FEV6/FVC Ratio: 99 %
RV % PRED: 102 %
RV: 2.34 L
TLC % pred: 89 %
TLC: 4.81 L

## 2015-10-20 NOTE — Progress Notes (Signed)
PFT done today. 

## 2015-10-20 NOTE — Progress Notes (Signed)
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 Pulmonary Consult    Referred by Dr. Sanda Linger. Pt states was dxed with PNA on 06/29/14 while in [place]. She was hospitalized for 7 days. She states that she had been having SOB, cough and CP but she feels much improved now.   "my cxr always shows emphysema" but not pt denies any baseline doe and though was sob when acutely ill in [place] is back to baseline now and Not limited by breathing from desired activities  rec Try prilosec   Take 30-60 min before first meal of the day and Pepcid 20 mg one bedtime   GERD diet   08/06/2014 f/u ov/[name] re: s/p pna  Chief Complaint  Patient presents with  . Follow-up    CXR done today. breathing same, not much problem with breathing from PNA; no complaints  still coughing   But non productive, able to sleep s am exac  rec Try prilosec   Take 30-60 min before first meal of the day and Pepcid 20 mg one bedtime until  Return  GERD diet   09/10/2014 f/u ov/[name] re: cough p ? Pna/ ? BOOP Chief Complaint  Patient presents with  . Follow-up    PFT done today. Pt states her breathing is overall doing well. She states that her cough has slightly improved, but hoarsness is worse.   rec  GERD diet  You will need to see an ENT doctor  next as I don't think the thyroid medication is what is causing your throat pain> did not go      09/08/2015  f/u ov/[name] re: ? Scleroderma  Chief Complaint  Patient presents with  . 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 She denies any new co's today.   Not limited by breathing from desired activities  But by balance Hoarseness waxex and wanes/ no ent x 10y with prev dx vcd at [facility] Swallowing def worse x one year. rec Please see patient coordinator before you leave today  to schedule DgEsophagram> dysmotility/ no GERD     10/20/2015  f/u ov/[name] re:  ? Scleroderma  Chief Complaint  Patient presents with  . Follow-up    PFT done today.  no concerns.  best position is supine 30 degrees under wedge pillow otherwise can't sleep without sob  Not limited by breathing from desired activities  - denies dysphagia/ off all gerd rx     No obvious day to day or daytime variabilty or assoc excess/ purulent sputum or mucus plugs  or cp or chest tightness, subjective wheeze overt sinus or hb symptoms. No  unusual exp hx or h/o childhood pna/ asthma or knowledge of premature birth.  Sleeping ok without nocturnal  or early am exacerbation  of respiratory  c/o's or need for noct saba. Also denies any obvious fluctuation of symptoms with weather or environmental changes or other aggravating or alleviating factors except as outlined above   Current Medications, Allergies, Complete Past Medical History, Past Surgical History, Family History, and Social History were reviewed in [employer] record.  ROS  The following are not active complaints unless bolded sore throat, dysphagia, dental problems, itching, sneezing,  nasal congestion or excess/ purulent secretions, ear ache,   fever, chills, sweats, unintended wt loss, pleuritic or exertional cp, hemoptysis,  orthopnea pnd or leg swelling, presyncope,  palpitations, heartburn, abdominal pain, anorexia, nausea, vomiting, diarrhea  or change in bowel or urinary habits, change in stools or urine, dysuria,hematuria,  rash, arthralgias, visual complaints, headache, numbness weakness or ataxia or problems with walking or coordination,  change in mood/affect or memory.                  Objective:   Physical Exam  Stoic amb hoarse wf nad   08/06/2014       100  >  09/10/2014 104 > 09/08/2015   110 > 10/20/2015    109    07/17/14 99 lb 9.6 oz (45.178 kg)  07/14/14 96 lb 12 oz (43.886 kg)  07/14/14 96 lb 3.2 oz (43.636 kg)    Vital signs reviewed   HEENT: nl dentition, turbinates, and orophanx. Nl external ear canals without cough reflex   NECK :  without JVD/Nodes/TM/ nl carotid upstrokes bilaterally   LUNGS: no acc muscle use, clear to A and P bilaterally without cough on insp or exp maneuvers   CV:  RRR  no s3 or murmur or increase in P2, no edema   ABD:  soft and nontender with nl excursion in the supine position. No bruits or organomegaly, bowel sounds nl  MS:  warm without deformities, calf tenderness, cyanosis or clubbing  SKIN: warm and dry without lesions         CXR PA and Lateral:   09/08/2015 :    I personally reviewed images and agree with radiology impression as follows:    Right apical pleural parenchymal thickening noted consistent with scarring . No interim change. No acute abnormality    Labs   reviewed include:     Lab Results  Component Value Date   ESRSEDRATE 21 09/10/2014   ESRSEDRATE 50* 07/17/2014    Lab Results  Component Value Date   WBC 6.4 09/10/2014   HGB 14.1 09/10/2014   HCT 42.6 09/10/2014   MCV 95.3 09/10/2014   PLT 231.0 09/10/2014  No eos      Assessment & Plan:

## 2015-10-20 NOTE — Patient Instructions (Addendum)
Pulmonary follow up is as needed - ENT follow up would probably be more fruitful as per your previous work up at [employer] working on [employer]

## 2015-10-21 NOTE — Assessment & Plan Note (Signed)
Dg esophagram 09/18/15 >Mild lower esophageal dysmotility. No fixed esophageal narrowing or stricture  No change on or off gerd rx > ok to leave off and f/u with GI prn (Dr Mickel Baas at [facility] would be a good choice as she has interest in esophageal disorders)

## 2015-10-21 NOTE — Assessment & Plan Note (Signed)
eval for vcd [facility] early 2000s > rec ent f/u 09/10/14 and max gerd rx > did not f/u as rec  - PFT's 10/20/2015 erratic insp loop but no true truncation > referred back to ENT prn

## 2015-10-21 NOTE — Assessment & Plan Note (Signed)
pfts s airflow obst 09/10/14  - PFT's 10/20/2015 min restrictive changes, no obst > no pulmonary f/u needed   I had an extended final summary discussion with the patient reviewing all relevant studies completed to date and  lasting 15 to 20 minutes of a 25 minute visit on the following issues:    1) have not ruled out some form of scleroderma at this point but no evidence of pulmonary involvement even if she does have some form of it.  2)  Main issues are upper airway with f/u by ent the next step > I would prefer Dr Delford Field at [facility] if she is willing to go   3) pulmonary f/u is prn

## 2015-11-05 ENCOUNTER — Other Ambulatory Visit (INDEPENDENT_AMBULATORY_CARE_PROVIDER_SITE_OTHER): Payer: Commercial Managed Care - HMO

## 2015-11-05 ENCOUNTER — Ambulatory Visit (INDEPENDENT_AMBULATORY_CARE_PROVIDER_SITE_OTHER): Payer: Commercial Managed Care - HMO | Admitting: Internal Medicine

## 2015-11-05 ENCOUNTER — Encounter: Payer: Self-pay | Admitting: Internal Medicine

## 2015-11-05 VITALS — BP 100/60 | HR 88 | Temp 97.7°F | Resp 12 | Ht 66.0 in | Wt 111.0 lb

## 2015-11-05 DIAGNOSIS — R239 Unspecified skin changes: Secondary | ICD-10-CM | POA: Diagnosis not present

## 2015-11-05 DIAGNOSIS — E039 Hypothyroidism, unspecified: Secondary | ICD-10-CM | POA: Diagnosis not present

## 2015-11-05 DIAGNOSIS — R1310 Dysphagia, unspecified: Secondary | ICD-10-CM

## 2015-11-05 DIAGNOSIS — R490 Dysphonia: Secondary | ICD-10-CM | POA: Diagnosis not present

## 2015-11-05 LAB — TSH: TSH: 2.99 u[IU]/mL (ref 0.35–4.50)

## 2015-11-05 LAB — T3, FREE: T3 FREE: 4.1 pg/mL (ref 2.3–4.2)

## 2015-11-05 LAB — T4, FREE: FREE T4: 0.9 ng/dL (ref 0.60–1.60)

## 2015-11-05 NOTE — Progress Notes (Signed)
   Subjective:    [name], female    DOB: [birth year], 70 y.o.   MRN: [id number]  HPI The patient is a 70 YO female coming in for her chronic cough. She feels that it is coming from her sinuses. She would like to see an ENT as she has not been able to help it with what we have tried and on her own. Several medications she is intolerant to. Also having some problems swallowing and has had multiple GI workup and wants ENT opinion.   Review of Systems  Constitutional: Positive for fatigue. Negative for fever, activity change, appetite change and unexpected weight change.  HENT: Positive for congestion, rhinorrhea and trouble swallowing. Negative for sneezing and sore throat.   Eyes: Negative.   Respiratory: Positive for cough. Negative for chest tightness, shortness of breath and wheezing.   Cardiovascular: Negative for chest pain, palpitations and leg swelling.  Gastrointestinal: Negative for nausea, abdominal pain, diarrhea, constipation and abdominal distention.  Musculoskeletal: Positive for gait problem. Negative for arthralgias.  Skin: Negative.   Neurological: Positive for weakness and numbness. Negative for dizziness, syncope and light-headedness.  Psychiatric/Behavioral: Negative.       Objective:   Physical Exam  Constitutional: She is oriented to person, place, and time. She appears well-developed and well-nourished.  HENT:  Head: Normocephalic and atraumatic.  Oropharynx with mild erythema and clear drainage.   Eyes: EOM are normal.  Neck: Normal range of motion.  Cardiovascular: Normal rate and regular rhythm.   Pulmonary/Chest: Effort normal and breath sounds normal. No respiratory distress. She has no wheezes. She has no rales.  Abdominal: Soft. Bowel sounds are normal. She exhibits no distension. There is no tenderness.  Neurological: She is alert and oriented to person, place, and time. A cranial nerve deficit is present.  Changes in sensation  Skin:  Skin is warm and dry.   Filed Vitals:   11/05/15 1437  BP: 100/60  Pulse: 88  Temp: 97.7 F (36.5 C)  TempSrc: Oral  Resp: 12  Height: 5\' 6"  (1.676 m)  Weight: 111 lb (50.349 kg)  SpO2: 98%      Assessment & Plan:

## 2015-11-05 NOTE — Patient Instructions (Signed)
We are checking the labs and will send you to ENT.

## 2015-11-05 NOTE — Progress Notes (Signed)
Pre visit review using our clinic review tool, if applicable. No additional management support is needed unless otherwise documented below in the visit note. 

## 2015-11-06 LAB — CENTROMERE ANTIBODIES: Centromere Ab Screen: <1

## 2015-11-06 LAB — ANTI-NUCLEAR AB-TITER (ANA TITER): ANA Titer 1: 1:40 {titer} — ABNORMAL HIGH

## 2015-11-06 LAB — ANTI-SCLERODERMA ANTIBODY: Scleroderma (Scl-70) (ENA) Antibody, IgG: <1

## 2015-11-06 LAB — ANA: ANA: POSITIVE — AB

## 2015-11-08 NOTE — Assessment & Plan Note (Signed)
Refer to ENT for evaluation. 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

## 2015-11-08 NOTE — Assessment & Plan Note (Signed)
Has had workup with pulmonary and GI and will now refer to ENT. She is using saline nose spray for her allergies right now.

## 2015-11-09 ENCOUNTER — Telehealth: Payer: Self-pay | Admitting: *Deleted

## 2015-11-09 DIAGNOSIS — N816 Rectocele: Secondary | ICD-10-CM

## 2015-11-09 MED ORDER — ESTRADIOL 0.025 MG/24HR TD PTWK: 0.0250 mg | MEDICATED_PATCH | TRANSDERMAL | Status: DC

## 2015-11-09 NOTE — Telephone Encounter (Signed)
Prescription refill for 2 patches approved by Dr. Erin Fulling and prescription sent to her pharmacy. Called Catherine Gregory and let her know refill for 2 patches approved to last until her appointment. We discussed that she must be seen first and then doctor will approve further refills as needed.

## 2015-11-09 NOTE — Telephone Encounter (Signed)
Pt may have refill x 1 or 2 until next visit but, needs yearly f/u while on EES.

## 2015-11-09 NOTE — Telephone Encounter (Signed)
Received a voicemail from [facility] pharmacy asking for a refill for estradiol 0.025; state she has filled it 12 /20/16 and they usually dispense 12 at a time.  Per chart review last office visit 11/14/14, last prescription written 11/14/14. Will send to Dr. Erin Fulling for approval or denial. Called pharmacy and left message notifying them we got request and are forwarding to MD>

## 2015-11-10 ENCOUNTER — Other Ambulatory Visit: Payer: Self-pay | Admitting: Internal Medicine

## 2015-11-10 DIAGNOSIS — R768 Other specified abnormal immunological findings in serum: Secondary | ICD-10-CM

## 2015-11-20 ENCOUNTER — Encounter: Payer: Self-pay | Admitting: Obstetrics & Gynecology

## 2015-11-20 ENCOUNTER — Ambulatory Visit: Payer: Commercial Managed Care - HMO | Admitting: Obstetrics & Gynecology

## 2015-11-20 DIAGNOSIS — Z5181 Encounter for therapeutic drug level monitoring: Secondary | ICD-10-CM

## 2015-11-20 DIAGNOSIS — N816 Rectocele: Secondary | ICD-10-CM

## 2015-11-20 DIAGNOSIS — Z79890 Hormone replacement therapy (postmenopausal): Principal | ICD-10-CM

## 2015-11-20 MED ORDER — ESTRADIOL 0.025 MG/24HR TD PTWK
0.0250 mg | MEDICATED_PATCH | TRANSDERMAL | Status: DC
Start: 2015-11-20 — End: 2016-11-04

## 2015-11-20 NOTE — Patient Instructions (Signed)

## 2015-11-20 NOTE — Progress Notes (Signed)
[name], female   DOB: 07/25/1946, 70 y.o.   MRN: [id number] Pt was told that she needed a f/u for refills. Her last appt was 05/2015.  This was our error.  She was given a refill for Cl[place] patch and will f/u in oct 2017.  She has no complaints. I reviewed the results of her thyroid panel with her.  Hipolito Martinezlopez L. Harraway-Smith, M.D., [place] [place]

## 2015-11-24 DIAGNOSIS — R760 Raised antibody titer: Secondary | ICD-10-CM | POA: Diagnosis not present

## 2015-11-24 DIAGNOSIS — M2550 Pain in unspecified joint: Secondary | ICD-10-CM | POA: Diagnosis not present

## 2015-11-24 DIAGNOSIS — R05 Cough: Secondary | ICD-10-CM | POA: Diagnosis not present

## 2015-11-24 DIAGNOSIS — M7531 Calcific tendinitis of right shoulder: Secondary | ICD-10-CM | POA: Diagnosis not present

## 2015-12-08 DIAGNOSIS — R05 Cough: Secondary | ICD-10-CM | POA: Diagnosis not present

## 2015-12-08 DIAGNOSIS — K219 Gastro-esophageal reflux disease without esophagitis: Secondary | ICD-10-CM | POA: Diagnosis not present

## 2015-12-08 DIAGNOSIS — R490 Dysphonia: Secondary | ICD-10-CM | POA: Diagnosis not present

## 2015-12-10 DIAGNOSIS — G35 Multiple sclerosis: Secondary | ICD-10-CM | POA: Diagnosis not present

## 2015-12-10 DIAGNOSIS — H2513 Age-related nuclear cataract, bilateral: Secondary | ICD-10-CM | POA: Diagnosis not present

## 2015-12-10 DIAGNOSIS — Z0100 Encounter for examination of eyes and vision without abnormal findings: Secondary | ICD-10-CM | POA: Diagnosis not present

## 2015-12-29 DIAGNOSIS — G603 Idiopathic progressive neuropathy: Secondary | ICD-10-CM | POA: Diagnosis not present

## 2015-12-29 DIAGNOSIS — M542 Cervicalgia: Secondary | ICD-10-CM | POA: Diagnosis not present

## 2015-12-29 DIAGNOSIS — M5412 Radiculopathy, cervical region: Secondary | ICD-10-CM | POA: Diagnosis not present

## 2015-12-29 DIAGNOSIS — M5481 Occipital neuralgia: Secondary | ICD-10-CM | POA: Diagnosis not present

## 2015-12-29 DIAGNOSIS — G510 Bell's palsy: Secondary | ICD-10-CM | POA: Diagnosis not present

## 2016-01-13 DIAGNOSIS — L718 Other rosacea: Secondary | ICD-10-CM | POA: Diagnosis not present

## 2016-01-21 ENCOUNTER — Other Ambulatory Visit: Payer: Self-pay

## 2016-01-21 DIAGNOSIS — Z1231 Encounter for screening mammogram for malignant neoplasm of breast: Secondary | ICD-10-CM

## 2016-01-22 ENCOUNTER — Ambulatory Visit
Admission: RE | Admit: 2016-01-22 | Discharge: 2016-01-22 | Disposition: A | Discharge status: Home / Self Care | Payer: Commercial Managed Care - HMO | Source: Ambulatory Visit

## 2016-01-22 DIAGNOSIS — Z1231 Encounter for screening mammogram for malignant neoplasm of breast: Secondary | ICD-10-CM | POA: Diagnosis not present

## 2016-02-22 ENCOUNTER — Encounter: Payer: Self-pay | Admitting: Internal Medicine

## 2016-05-30 IMAGING — DX DG CHEST 2V
2 series · 2 of 2 positions shown · non-contrast
Comparison: 09/10/2014.

CLINICAL DATA: Chronic cough.

EXAM:
CHEST  2 VIEW

[chest pa]
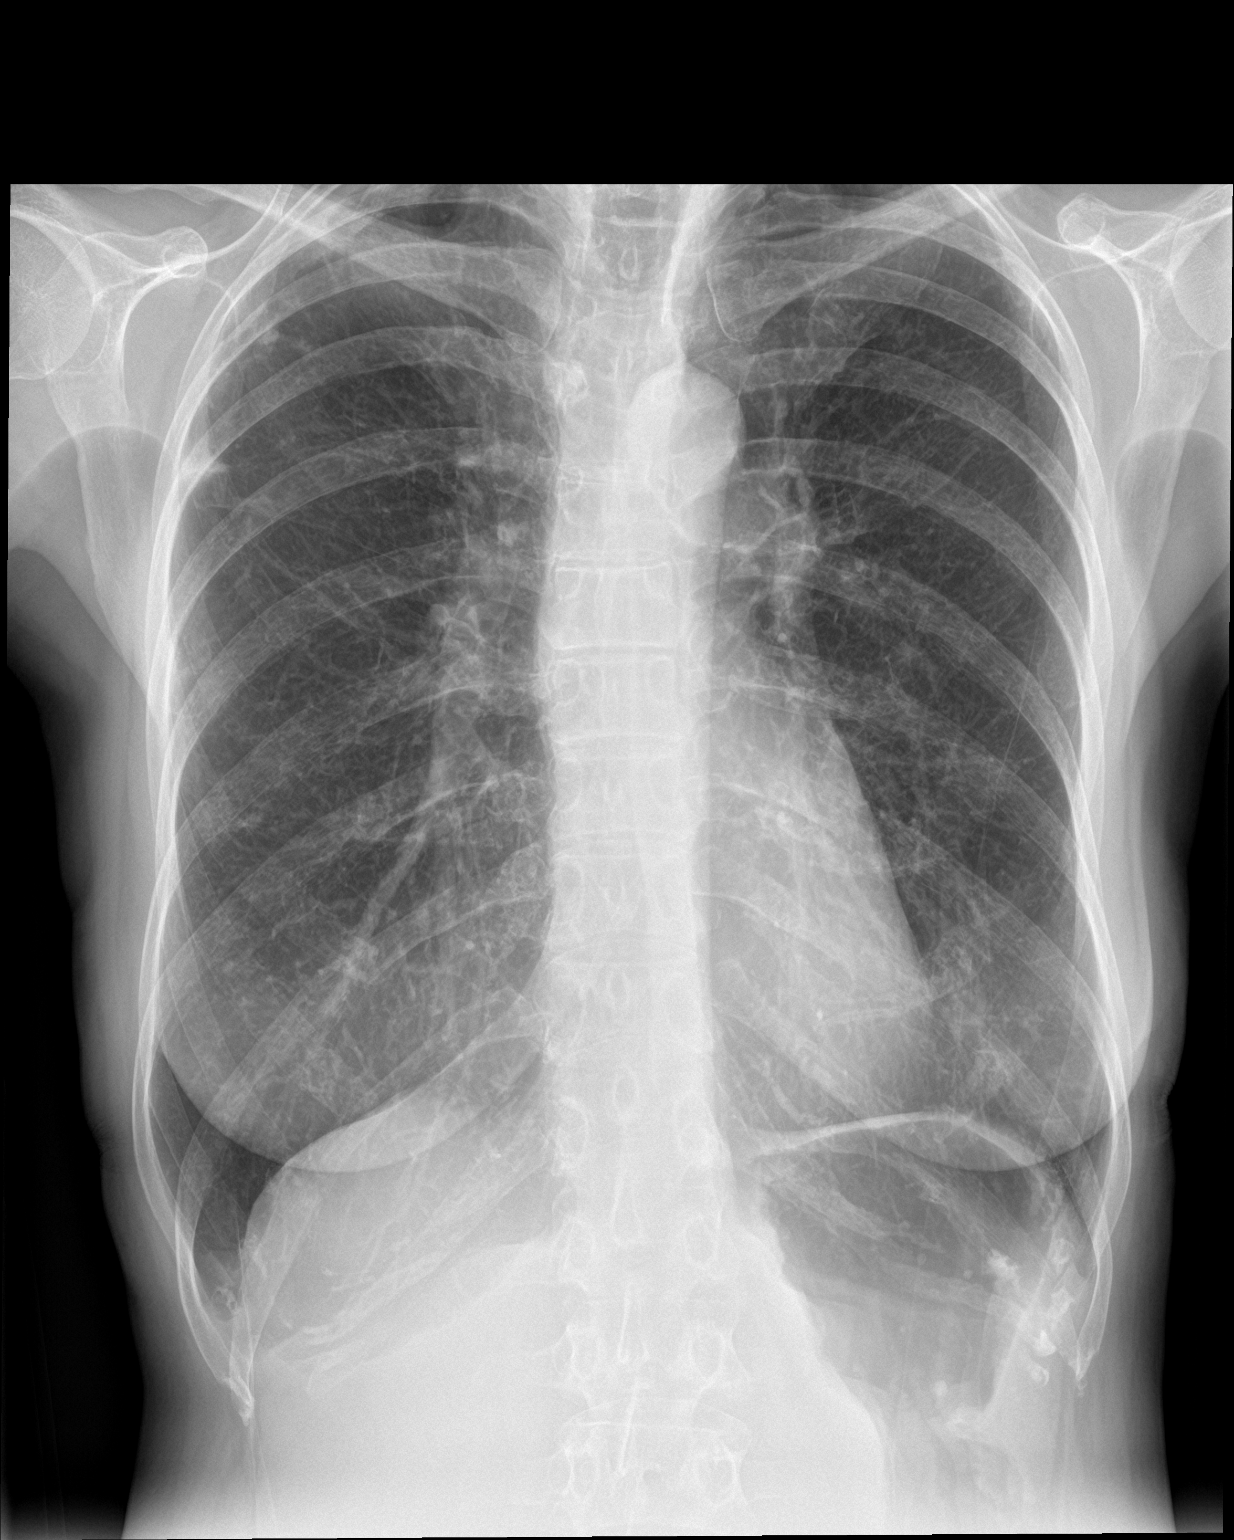

[chest lat]
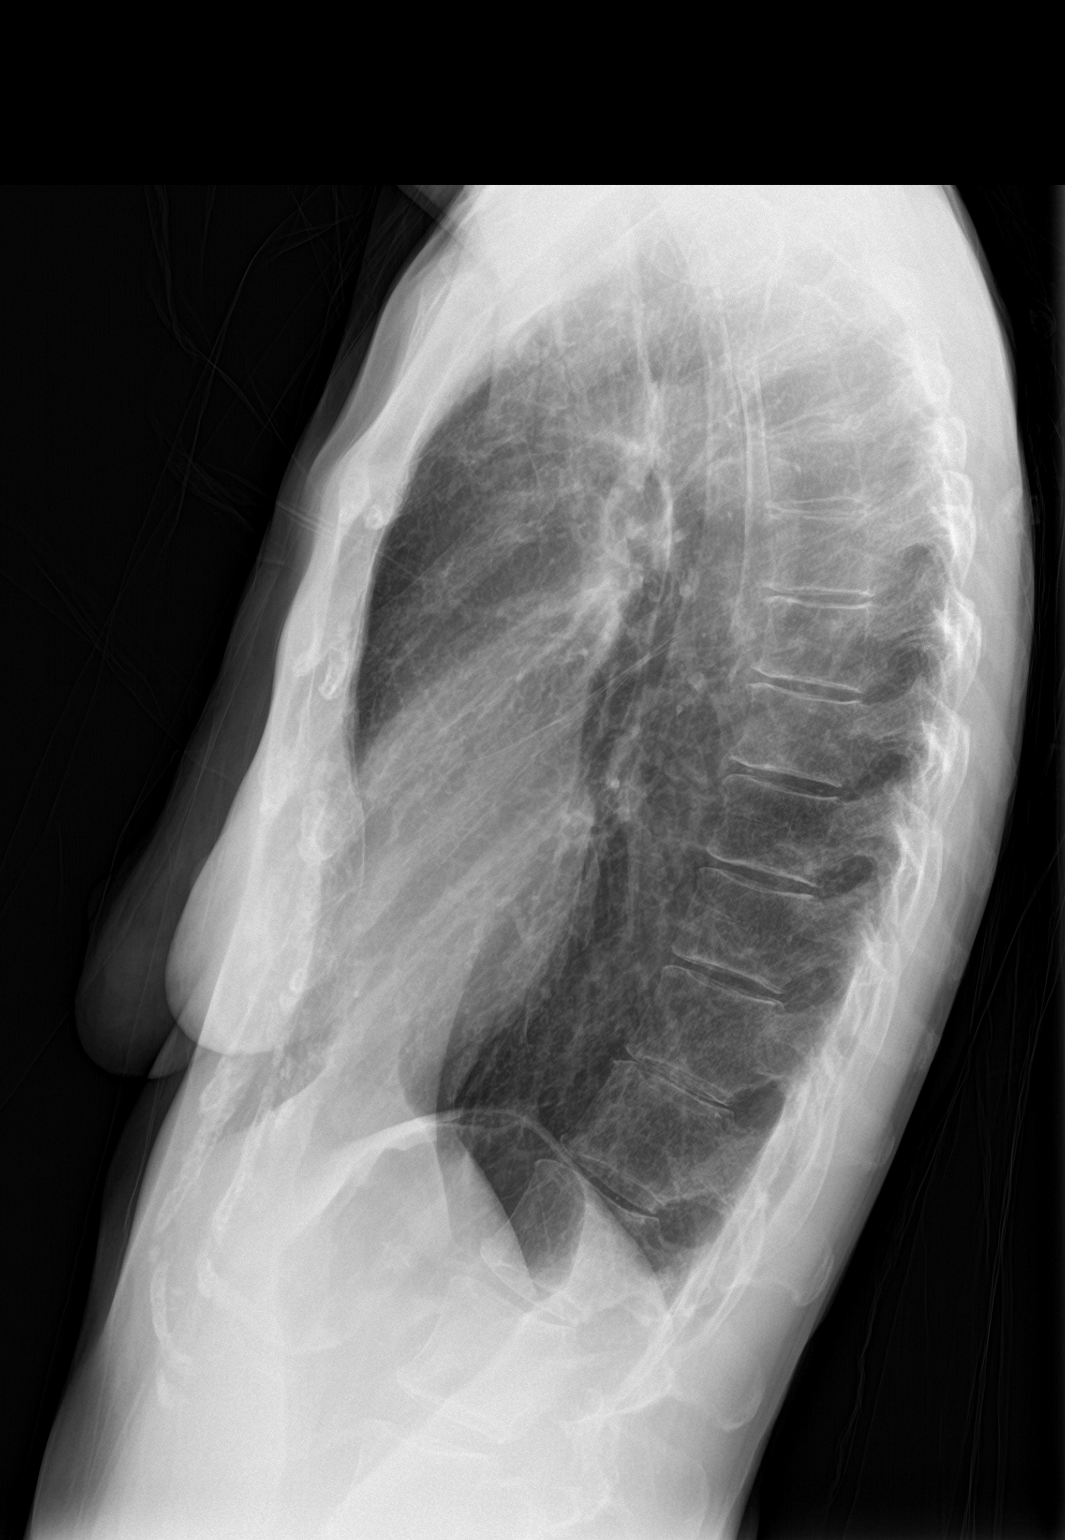

[2 of 2 positions shown; findings below may reference images not displayed]

FINDINGS: Mediastinum and hilar structures are normal. Lungs are clear of
acute infiltrates. Stable changes a right apical pleural parenchymal
thickening and scarring. Pectus deformity.
IMPRESSION: Right apical pleural parenchymal thickening noted consistent with
scarring . No interim change. No acute abnormality .

## 2016-07-11 DIAGNOSIS — Z23 Encounter for immunization: Secondary | ICD-10-CM | POA: Diagnosis not present

## 2016-09-09 ENCOUNTER — Ambulatory Visit (INDEPENDENT_AMBULATORY_CARE_PROVIDER_SITE_OTHER): Payer: Medicare HMO | Admitting: *Deleted

## 2016-09-09 VITALS — BP 128/88 | HR 83 | Resp 18 | Ht 66.0 in | Wt 104.5 lb

## 2016-09-09 DIAGNOSIS — Z0000 Encounter for general adult medical examination without abnormal findings: Secondary | ICD-10-CM

## 2016-09-09 DIAGNOSIS — E2839 Other primary ovarian failure: Secondary | ICD-10-CM | POA: Diagnosis not present

## 2016-09-09 NOTE — Progress Notes (Signed)
Medical screening examination/treatment/procedure(s) were performed by non-physician practitioner and as supervising physician I was immediately available for consultation/collaboration. I agree with above. Jaquavious Mercer A Zurii Hewes, MD 

## 2016-09-09 NOTE — Patient Instructions (Signed)
  Catherine Gregory , Thank you for taking time to come for your Medicare Wellness Visit. I appreciate your ongoing commitment to your health goals. Please review the following plan we discussed and let me know if I can assist you in the future.   These are the goals we discussed: Goals    . Increase physical activity       This is a list of the screening recommended for you and due dates:  Health Maintenance  Topic Date Due  . Tetanus Vaccine  11/04/2016*  . Pneumonia vaccines (1 of 2 - PCV13) 11/04/2016*  . Mammogram  01/21/2018  . Colon Cancer Screening  08/30/2020  . Flu Shot  Addressed  . DEXA scan (bone density measurement)  Completed  . Shingles Vaccine  Addressed  .  Hepatitis C: One time screening is recommended by Center for Disease Control  (CDC) for  adults born from [birth year] through 1966.   Addressed  *Topic was postponed. The date shown is not the original due date.

## 2016-09-09 NOTE — Addendum Note (Signed)
Addended by: Starla Link on: 09/09/2016 01:42 PM   Modules accepted: Orders

## 2016-09-09 NOTE — Progress Notes (Signed)
Subjective:   Catherine Gregory is a 71 y.o. female who presents for Medicare Annual (Subsequent) preventive examination.  She reports chronic shoulder pain that has worsened recently. Both shoulders are affected, R>L. Limited ROM in R shoulder-cannot lift arm above shoulder. Pain is intense and intermittent, worsens w/ certain movements. She is limiting her driving due to the pain and decreased ROM.  She has struggled w/ her weight since her hysterectomy. She would like to gain weight, but cannot seem to do so. She has a sensitive stomach that limits her dietary choices. She is unable to tolerate dietary supplements such as Ensure due to ongoing diffuse GI symptoms including reflux, abdominal discomfort, and nausea.  She has what she calls a 'hot/cold problem' that has been ongoing for many years. She is either hot or cold but 'never comfortable' and she 'never sweats.' Sometimes she feels like she has 'a little mist' on her face, but when she touches her face there is nothing there. 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 She has remote PMH of thyroid disease, not currently on medication.   She reports ongoing 'balance problems' r/t peripheral neuropathy. Denies any recent falls. Does not use aide device-'I can't see myself being able to hold onto anything d/t the neuropathy in my hands.' She has done some PT out of state in the past but she did not finish the complete course of treatment.  She desires follow-up w/ PCP regarding shoulder pain and 'hot/cold problem.'  Review of Systems:  No ROS.  Medicare Wellness Visit.  Cardiac Risk Factors include: advanced age (>39men, >77 women);sedentary lifestyle  Sleep patterns: does not feel rested on waking, gets up 2-3x times nightly to void and 6-6.5 hours nightly. Does not nap during the day.  Home Safety/Smoke Alarms: Feels safe in home. Smoke alarms in place.  Living environment; residence and Firearm Safety: Lives w/  brother and his wife on ground floor of apartment, firearms stored safely. Seat Belt Safety/Bike Helmet: Wears seat belt.   Counseling:   Eye Exam- Dr. Randon Goldsmith yearly. Wears glasses. No issues reported.  Dental- Follows w/ Dr. Janus Molder every 6 months-1 year. No dentures.  Female:   Pap- N/A, hysterectomy      Mammo- last 01/22/16. BI-RADS CATEGORY  1: Negative.       Dexa scan- last 09/07/11. Osteopenia.          CCS- last 08/30/10. Patient reported, no report on file. Pt cannot remember name of provider or recall. Declines f/u screening at this time.     Objective:     Vitals: BP 128/88 (BP Location: Right Arm, Patient Position: Sitting, Cuff Size: Normal)   Pulse 83   Resp 18   Ht 5\' 6"  (1.676 m)   Wt 104 lb 8 oz (47.4 kg)   SpO2 93%   BMI 16.87 kg/m   Body mass index is 16.87 kg/m.  Wt Readings from Last 3 Encounters:  09/09/16 104 lb 8 oz (47.4 kg)  11/05/15 111 lb (50.3 kg)  10/20/15 109 lb (49.4 kg)   Tobacco History  Smoking Status  . Never Smoker  Smokeless Tobacco  . Never Used     Counseling given: Not Answered   Past Medical History:  Diagnosis Date  . Abdominal pain, chronic, generalized   . Abdominal pain, RUQ   . Abnormal weight loss   . Cachexia (HCC)   . Carpal tunnel syndrome   . Constipation   . COPD (chronic  obstructive pulmonary disease) (HCC)   . Depression with anxiety   . Disturbance of skin sensation 08/13/2013  . Dysphagia, idiopathic   . Elevated antinuclear antibody (ANA) level   . GERD (gastroesophageal reflux disease)   . GERD (gastroesophageal reflux disease)   . Hiatal hernia   . Hyperlipidemia   . Idiopathic peripheral neuropathy   . Lymphadenopathy, inguinal   . Malnutrition (HCC)   . Microscopic hematuria   . Migraine   . Myalgia and myositis, unspecified 08/13/2013  . Nephrocalcinosis   . Renal calculus   . Right thyroid nodule   . Spleen enlarged   . Vitamin B12 deficiency   . Vitamin D deficiency    Past Surgical  History:  Procedure Laterality Date  . ABDOMINAL HYSTERECTOMY  1998  . Bx of skin at outer ankle Bilateral April or May of 2016  . CHOLECYSTECTOMY  1978  . DILATION AND CURETTAGE OF UTERUS  1998  . NASAL SEPTUM SURGERY  1984  . TUBAL LIGATION Bilateral 1988   Family History  Problem Relation Age of Onset  . Heart Problems Father   . Hyperlipidemia Father   . Heart disease Father   . Hypertension Father   . Multiple sclerosis Sister   . Heart Problems Brother   . Hypertension Brother   . Heart Problems Brother   . Hypertension Brother   . Hypertension Brother   . Hypertension Brother   . Hypertension Brother   . Schizophrenia Mother   . Cancer Maternal Aunt     Colon   History  Sexual Activity  . Sexual activity: Not Currently    Outpatient Encounter Prescriptions as of 09/09/2016  Medication Sig  . Cholecalciferol (VITAMIN D3) LIQD Take 6,000 Units by mouth daily. Reported on 11/05/2015  . estradiol (CLIMARA) 0.025 mg/24hr patch Place 1 patch (0.025 mg total) onto the skin once a week.   No facility-administered encounter medications on file as of 09/09/2016.     Activities of Daily Living In your present state of health, do you have any difficulty performing the following activities: 09/09/2016  Hearing? N  Vision? N  Difficulty concentrating or making decisions? N  Walking or climbing stairs? N  Dressing or bathing? N  Doing errands, shopping? N  Preparing Food and eating ? N  Using the Toilet? N  In the past six months, have you accidently leaked urine? Y  Do you have problems with loss of bowel control? N  Managing your Medications? N  Managing your Finances? N  Housekeeping or managing your Housekeeping? N  Some recent data might be hidden    Patient Care Team: Myrlene Broker, MD as PCP - General (Internal Medicine) Aletha Halim, MD as Consulting Physician (Neurology) Willodean Rosenthal, MD as Consulting Physician (Obstetrics and  Gynecology) Antony Contras, MD as Consulting Physician (Ophthalmology)    Assessment:    Physical assessment deferred to PCP.  Exercise Activities and Dietary recommendations Current Exercise Habits: The patient does not participate in regular exercise at present, Exercise limited by: neurologic condition(s)  Diet (meal preparation, eat out, water intake, caffeinated beverages, dairy products, fruits and vegetables): in general, a "healthy" diet  , on average, 3 meals per day. Prepares own meals. 'I'm something of a health nut.' Does not eat sugar or processed foods. Eats lots of eggs, nuts, nut butter, cabbage, brussel sprouts, carrots, radishes, chicken, avocados, beef. Does not eat much whole grains because stomach will not tolerate. Does not eat fruit because acidity bothers her.  1 banana daily, 1-3 prunes daily. Drinks water and milk. Eats 1 snack daily- toast w/ almond butter and honey and green tea.   Goals    . Increase physical activity      Fall Risk Fall Risk  09/09/2016 07/01/2015  Falls in the past year? No No   Depression Screen PHQ 2/9 Scores 09/09/2016 07/01/2015  PHQ - 2 Score 1 0     Cognitive Function MMSE - Mini Mental State Exam 09/09/2016 07/01/2015  Not completed: - (No Data)  Orientation to time 5 -  Orientation to Place 5 -  Registration 3 -  Attention/ Calculation 5 -  Recall 2 -  Language- name 2 objects 2 -  Language- repeat 1 -  Language- follow 3 step command 3 -  Language- read & follow direction 1 -  Write a sentence 1 -  Copy design 1 -  Total score 29 -        Immunization History  Administered Date(s) Administered  . Influenza,inj,Quad PF,36+ Mos 07/31/2014, 07/01/2015  . Influenza-Unspecified 06/29/2016  . Pneumococcal Polysaccharide-23 03/12/2010   Screening Tests Health Maintenance  Topic Date Due  . TETANUS/TDAP  11/04/2016 (Originally 08/27/1965)  . PNA vac Low Risk Adult (1 of 2 - PCV13) 11/04/2016 (Originally 03/13/2011)  .  MAMMOGRAM  01/21/2018  . COLONOSCOPY  08/30/2020  . INFLUENZA VACCINE  Addressed  . DEXA SCAN  Completed  . ZOSTAVAX  Addressed  . Hepatitis C Screening  Addressed      Plan:   Follow-up w/ PCP as scheduled next week. >> 30 min appt scheduled to allow adequate time.  DEXA ordered.  Chronic diffuse GI symptoms- Ongoing, symptoms unchanged from previous per pt. She is not interested in further workup/GI referral at this time. She did not follow through w/ referral placed last year.  Declines pneumococcal and tetanus vaccinations.   Balance issues- Gait normal. Pt declines PT at this time. Defer to PCP for additional recs.   During the course of the visit the patient was educated and counseled about the following appropriate screening and preventive services:   Vaccines to include Pneumoccal, Influenza, Hepatitis B, Td, Zostavax, HCV  Cardiovascular Disease  Colorectal cancer screening  Bone density screening  Diabetes screening  Glaucoma screening  Mammography/PAP  Nutrition counseling   Patient Instructions (the written plan) was given to the patient.   Starla Link, RN  09/09/2016

## 2016-09-13 ENCOUNTER — Encounter: Payer: Self-pay | Admitting: Internal Medicine

## 2016-09-13 ENCOUNTER — Other Ambulatory Visit (INDEPENDENT_AMBULATORY_CARE_PROVIDER_SITE_OTHER): Payer: Medicare HMO

## 2016-09-13 ENCOUNTER — Ambulatory Visit (INDEPENDENT_AMBULATORY_CARE_PROVIDER_SITE_OTHER): Payer: Medicare HMO | Admitting: Internal Medicine

## 2016-09-13 VITALS — BP 110/62 | HR 100 | Temp 98.1°F | Ht 66.0 in | Wt 106.0 lb

## 2016-09-13 DIAGNOSIS — E559 Vitamin D deficiency, unspecified: Secondary | ICD-10-CM | POA: Diagnosis not present

## 2016-09-13 DIAGNOSIS — E538 Deficiency of other specified B group vitamins: Secondary | ICD-10-CM | POA: Diagnosis not present

## 2016-09-13 DIAGNOSIS — E039 Hypothyroidism, unspecified: Secondary | ICD-10-CM

## 2016-09-13 DIAGNOSIS — M25511 Pain in right shoulder: Secondary | ICD-10-CM | POA: Insufficient documentation

## 2016-09-13 DIAGNOSIS — M25512 Pain in left shoulder: Secondary | ICD-10-CM

## 2016-09-13 LAB — COMPREHENSIVE METABOLIC PANEL
ALT: 15 U/L (ref 0–35)
AST: 18 U/L (ref 0–37)
Albumin: 4 g/dL (ref 3.5–5.2)
Alkaline Phosphatase: 74 U/L (ref 39–117)
BILIRUBIN TOTAL: 0.3 mg/dL (ref 0.2–1.2)
BUN: 19 mg/dL (ref 6–23)
CALCIUM: 9.7 mg/dL (ref 8.4–10.5)
CHLORIDE: 101 meq/L (ref 96–112)
CO2: 27 meq/L (ref 19–32)
CREATININE: 0.57 mg/dL (ref 0.40–1.20)
GFR: 111.12 mL/min (ref >60.00)
GLUCOSE: 136 mg/dL — AB (ref 70–99)
Potassium: 4.2 mEq/L (ref 3.5–5.1)
SODIUM: 137 meq/L (ref 135–145)
Total Protein: 7.1 g/dL (ref 6.0–8.3)

## 2016-09-13 LAB — VITAMIN D 25 HYDROXY (VIT D DEFICIENCY, FRACTURES): VITD: 49.01 ng/mL (ref 30.00–100.00)

## 2016-09-13 LAB — VITAMIN B12: VITAMIN B 12: 423 pg/mL (ref 211–911)

## 2016-09-13 LAB — CBC
HCT: 42.6 % (ref 36.0–46.0)
Hemoglobin: 14.1 g/dL (ref 12.0–15.0)
MCHC: 33.2 g/dL (ref 30.0–36.0)
MCV: 93.3 fl (ref 78.0–100.0)
PLATELETS: 141 10*3/uL — AB (ref 150.0–400.0)
RBC: 4.57 Mil/uL (ref 3.87–5.11)
RDW: 14.6 % (ref 11.5–15.5)
WBC: 8.3 10*3/uL (ref 4.0–10.5)

## 2016-09-13 LAB — FERRITIN: FERRITIN: 75.1 ng/mL (ref 10.0–291.0)

## 2016-09-13 LAB — T4, FREE: FREE T4: 0.78 ng/dL (ref 0.60–1.60)

## 2016-09-13 LAB — TSH: TSH: 3.59 u[IU]/mL (ref 0.35–4.50)

## 2016-09-13 MED ORDER — DICLOFENAC SODIUM 1 % TD GEL: 2.0000 g | Freq: Four times a day (QID) | TRANSDERMAL | 3 refills | Status: DC

## 2016-09-13 NOTE — Progress Notes (Signed)
   Subjective:    [name], female    DOB: 07-23-1946, 71 y.o.   MRN: [id number]  HPI The patient is a 71 YO female coming in for bilateral shoulder pain. Worse in the right than the left. Limited ROM. Going on for 4-5 months. Her neurologist told her that this was related to her back and they gave her injections. These did not help with the shoulder pain and also she feels caused her hair to fall out some. She denies injury to the area. She is concerned about tendon problems given the lack of ROM. She is not able to lift with her hands extended. She is having severe pain with brushing her teeth or hair and is not able to drive much anymore due to having to turn the wheel abruptly is to painful. She has tried over the counter tylenol but does not like taking medicine for it.   Review of Systems  Constitutional: Positive for activity change and fatigue. Negative for appetite change, chills, fever and unexpected weight change.  HENT: Negative.   Eyes: Negative.   Respiratory: Negative.   Cardiovascular: Negative.   Gastrointestinal: Negative.   Musculoskeletal: Positive for arthralgias, myalgias and neck pain. Negative for back pain, gait problem, joint swelling and neck stiffness.  Skin: Negative.   Neurological: Positive for weakness. Negative for dizziness, facial asymmetry, light-headedness, numbness and headaches.      Objective:   Physical Exam  Constitutional: She is oriented to person, place, and time. She appears well-developed and well-nourished.  HENT:  Head: Normocephalic and atraumatic.  Eyes: EOM are normal.  Neck: Normal range of motion.  Some limited ROM from the shoulder pain  Cardiovascular: Normal rate and regular rhythm.   Pulmonary/Chest: Effort normal and breath sounds normal.  Abdominal: Soft.  Musculoskeletal: She exhibits tenderness.  Limited ROM in the shoulders and pain with ROM. Pain diffuse in the shoulder, biceps, neck. No pain in the  trapezius.   Neurological: She is alert and oriented to person, place, and time. Coordination normal.  Skin: Skin is warm and dry.   Vitals:   09/13/16 1437  BP: 110/62  Pulse: 100  Temp: 98.1 F (36.7 C)  TempSrc: Oral  SpO2: 96%  Weight: 106 lb (48.1 kg)  Height: 5\' 6"  (1.676 m)      Assessment & Plan:

## 2016-09-13 NOTE — Progress Notes (Signed)
Pre visit review using our clinic review tool, if applicable. No additional management support is needed unless otherwise documented below in the visit note. 

## 2016-09-13 NOTE — Patient Instructions (Signed)
We have sent in a gel for the shoulder which you can rub on up to 4 times a day.   We will also get you in with the sports medicine doctor about the shoulders to look at the tendons for any problems.

## 2016-09-13 NOTE — Assessment & Plan Note (Signed)
Rx for voltaren gel which she is willing to try and referral to sports medicine for evaluation and treatment.

## 2016-09-13 NOTE — Assessment & Plan Note (Signed)
Not on meds, no levels in almost 1 year will check today since she is here.

## 2016-09-16 ENCOUNTER — Encounter: Payer: Self-pay | Admitting: Internal Medicine

## 2016-09-16 NOTE — Assessment & Plan Note (Signed)
Checking B12 level today and no level in about 1 year.

## 2016-09-21 DIAGNOSIS — L600 Ingrowing nail: Secondary | ICD-10-CM | POA: Diagnosis not present

## 2016-09-28 NOTE — Progress Notes (Signed)
Catherine Gregory [address] [place], [place] [address] Phone: [phone] Subjective:    I'm seeing this patient by the request  of:  Myrlene Broker, MD   CC: Bilateral shoulder pain  YQM:[id number]  Catherine Gregory is a 71 y.o. female coming in with complaint of bilateral shoulder pain. Patient states that it seems to be right is worse than left. Has noticed limited range. We not nearly 6 months now. Patient's past medical history is significant for degenerative disc disease of the back and had an injection with no significant improvement of the shoulder pain though. States that it is making it difficult to do daily activities. Such things as brushing her teeth are coming her hair is causing significant amount pain. Can wake her up at night. Rates the severity of pain as 9 out of 10.   patient is an autoimmune workup in all lab tests were negative. Positive ANA but 1:40 tighter.  Past Medical History:  Diagnosis Date  . Abdominal pain, chronic, generalized   . Abdominal pain, RUQ   . Abnormal weight loss   . Cachexia (HCC)   . Carpal tunnel syndrome   . Constipation   . COPD (chronic obstructive pulmonary disease) (HCC)   . Depression with anxiety   . Disturbance of skin sensation 08/13/2013  . Dysphagia, idiopathic   . Elevated antinuclear antibody (ANA) level   . GERD (gastroesophageal reflux disease)   . GERD (gastroesophageal reflux disease)   . Hiatal hernia   . Hyperlipidemia   . Idiopathic peripheral neuropathy   . Lymphadenopathy, inguinal   . Malnutrition (HCC)   . Microscopic hematuria   . Migraine   . Myalgia and myositis, unspecified 08/13/2013  . Nephrocalcinosis   . Renal calculus   . Right thyroid nodule   . Spleen enlarged   . Vitamin B12 deficiency   . Vitamin D deficiency    Past Surgical History:  Procedure Laterality Date  . ABDOMINAL HYSTERECTOMY  1998  . Bx of skin at outer ankle Bilateral April or May of 2016    . CHOLECYSTECTOMY  1978  . DILATION AND CURETTAGE OF UTERUS  1998  . NASAL SEPTUM SURGERY  1984  . TUBAL LIGATION Bilateral 1988   Social History   Social History  . Marital status: Single    Spouse name: N/A  . Number of children: 2  . Years of education: COLLEGE-2   Occupational History  . RETIRED    Social History Main Topics  . Smoking status: Never Smoker  . Smokeless tobacco: Never Used  . Alcohol use No     Comment: FORMER WINE OCCASIONALLY/ QUIT 1997  . Drug use: No  . Sexual activity: Not Currently   Other Topics Concern  . None   Social History Narrative   She currently lives with brother and sister-in-law.   She has two grown children.  She moved from [place] in 2014 where she lived with her daughter.   Highest level of education:  2 years of college   She went on disability due to sensitivity to "chemical problems"          Allergies  Allergen Reactions  . Levaquin [Levofloxacin In D5w] Nausea And Vomiting  . Prednisone Other (See Comments)    'like I had a knife in my stomach'  . Augmentin [Amoxicillin-Pot Clavulanate]     Severe abdominal pain  . Contrast Media [Iodinated Diagnostic Agents]     "skaking all  over and passed out"  . Other Other (See Comments)    Fluorescien- body aches  . Albuterol Other (See Comments)    'I go nuts'  . Doxycycline Nausea And Vomiting  . Ducodyl [Bisacodyl]   . Erythromycin Nausea Only  . Fentanyl And Related Other (See Comments)    Blood pressure drops   Family History  Problem Relation Age of Onset  . Heart Problems Father   . Hyperlipidemia Father   . Heart disease Father   . Hypertension Father   . Multiple sclerosis Sister   . Heart Problems Brother   . Hypertension Brother   . Heart Problems Brother   . Hypertension Brother   . Hypertension Brother   . Hypertension Brother   . Hypertension Brother   . Schizophrenia Mother   . Cancer Maternal Aunt     Colon    Past medical history, social,  surgical and family history all reviewed in electronic medical record.  No pertanent information unless stated regarding to the chief complaint.   Review of Systems:Review of systems updated and as accurate as of 09/29/16 Patient positive review of systems noted  Objective  Blood pressure 110/64, pulse 73, height 5\' 6"  (1.676 m), weight 105 lb (47.6 kg), SpO2 98 %. Systems examined below as of 09/29/16   General: No apparent distress alert and oriented x3 mood and affect normal, dressed appropriately.  HEENT: Pupils equal, extraocular movements intact  Respiratory: Patient's speak in full sentences and does not appear short of breath  Cardiovascular: No lower extremity edema, non tender, no erythema  Skin: Warm dry intact with no signs of infection or rash on extremities or on axial skeleton.  Abdomen: Soft nontender  Neuro: Cranial nerves II through XII are intact, neurovascularly intact in all extremities with 2+ DTRs and 2+ pulses.  Lymph: No lymphadenopathy of posterior or anterior cervical chain or axillae bilaterally.  Gait normal with good balance and coordination.  MSK:  Non tender with full range of motion and good stability and symmetric strength and tone of shoulders, elbows, wrist, hip, knee and ankles bilaterally. Arthritic changes of multiple joints. Patient does have pain to light palpation that is out of proportion. Shoulder: Bilateral Inspection reveals no abnormalities, atrophy or asymmetry. Pain that is out of proportion to light palpation. Mild limitation in range of motion lacking the last 5 in all planes. Mild voluntary guarding noted bilaterally Rotator cuff strength normal throughout. Very mild impingement signs Speeds and Yergason's tests positive. No labral pathology noted with negative Obrien's, negative clunk and good stability. Normal scapular function observed. No painful arc and no drop arm sign. No apprehension sign  MSK US performed of: Right This  study was ordered, performed, and interpreted by Terrilee Files D.O.  Shoulder:   Supraspinatus:  Appears normal on long and transverse views, no bursal bulge seen with shoulder abduction on impingement view. Infraspinatus:  Appears normal on long and transverse views. Subscapularis:  Appears normal on long and transverse views. Teres Minor:  Appears normal on long and transverse views. AC joint:  Capsule undistended, no geyser sign. Glenohumeral Joint:  Appears normal without effusion. Glenoid Labrum:  Intact without visualized tears. Biceps Tendon:  Appears normal on long and transverse views, no fraying of tendon, tendon located in intertubercular groove, no subluxation with shoulder internal or external rotation. No increased power doppler signal. Impression: Normal shoulder ultrasound  Procedure note 97110; 15 minutes spent for Therapeutic exercises as stated in above notes.  This included  exercises focusing on stretching, strengthening, with significant focus on eccentric aspects.  Shoulder Exercises that included:  Basic scapular stabilization to include adduction and depression of scapula Scaption, focusing on proper movement and good control Internal and External rotation utilizing a theraband, with elbow tucked at side entire time Rows with theraband   Proper technique shown and discussed handout in great detail with ATC.  All questions were discussed and answered.     Impression and Recommendations:     This case required medical decision making of moderate complexity.      Note: This dictation was prepared with Dragon dictation along with smaller phrase technology. Any transcriptional errors that result from this process are unintentional.

## 2016-09-29 ENCOUNTER — Encounter: Payer: Self-pay | Admitting: Family Medicine

## 2016-09-29 ENCOUNTER — Ambulatory Visit (INDEPENDENT_AMBULATORY_CARE_PROVIDER_SITE_OTHER): Payer: Medicare HMO | Admitting: Family Medicine

## 2016-09-29 ENCOUNTER — Ambulatory Visit: Payer: Self-pay

## 2016-09-29 VITALS — BP 110/64 | HR 73 | Ht 66.0 in | Wt 105.0 lb

## 2016-09-29 DIAGNOSIS — M25512 Pain in left shoulder: Secondary | ICD-10-CM | POA: Diagnosis not present

## 2016-09-29 DIAGNOSIS — M797 Fibromyalgia: Secondary | ICD-10-CM | POA: Insufficient documentation

## 2016-09-29 DIAGNOSIS — M25511 Pain in right shoulder: Principal | ICD-10-CM

## 2016-09-29 DIAGNOSIS — G8929 Other chronic pain: Secondary | ICD-10-CM

## 2016-09-29 DIAGNOSIS — E559 Vitamin D deficiency, unspecified: Secondary | ICD-10-CM

## 2016-09-29 MED ORDER — VITAMIN D (ERGOCALCIFEROL) 1.25 MG (50000 UNIT) PO CAPS: 50000.0000 [IU] | ORAL_CAPSULE | ORAL | 0 refills | Status: DC

## 2016-09-29 NOTE — Assessment & Plan Note (Signed)
I believe the patient's pain in both shoulders is unfortunately secondary to more of her fibromyalgia versus possible chronic pain syndrome. Differential does include cervical radiculopathy but patient's neck seems to be unremarkable today. Patient does have near full range of motion of the shoulders. Very mild impingement. Ultrasound did not show any significant bony abnormality or any tendinopathy that can be contributing at this time. Patient's will start over-the-counter medications. Once to avoid oral prescription medications if possible secondary to patient's side effects and multiple allergies. We'll do icing protocol. Work with Event organiser to learn home exercises in greater detail. Follow-up again in 4 weeks.

## 2016-09-29 NOTE — Patient Instructions (Signed)
Good to see you.  Ice 20 minutes 2 times daily. Usually after activity and before bed. pennsaid pinkie amount topically 2 times daily as needed.  Once weekly vitamin D for 12 weeks to help with muscle strength and endurance.  Turmeric 500mg  1-2 times a day but watch your stomach  Tart cherry extract any dose could help with pain as well Exercises 3 times a week.  See me again in 4 weeks.

## 2016-09-29 NOTE — Assessment & Plan Note (Signed)
Placed on once weekly vitamin D for the next 12 weeks to help with muscle endurance and muscle strength. There is a correlation with low vitamin D and increasing fibromyalgia symptoms.

## 2016-10-06 ENCOUNTER — Ambulatory Visit (INDEPENDENT_AMBULATORY_CARE_PROVIDER_SITE_OTHER): Payer: Medicare HMO | Admitting: General Practice

## 2016-10-06 DIAGNOSIS — Z23 Encounter for immunization: Secondary | ICD-10-CM

## 2016-10-18 ENCOUNTER — Ambulatory Visit (INDEPENDENT_AMBULATORY_CARE_PROVIDER_SITE_OTHER)
Admission: RE | Admit: 2016-10-18 | Discharge: 2016-10-18 | Disposition: A | Discharge status: Home / Self Care | Payer: Medicare HMO | Source: Ambulatory Visit | Attending: Internal Medicine | Admitting: Internal Medicine

## 2016-10-18 DIAGNOSIS — E2839 Other primary ovarian failure: Secondary | ICD-10-CM | POA: Diagnosis not present

## 2016-11-01 ENCOUNTER — Telehealth: Payer: Self-pay | Admitting: *Deleted

## 2016-11-01 NOTE — Telephone Encounter (Addendum)
Pt left message stating that she needs to talk with a nurse about her prescription. The insurance has declined to pay and is recommending an alternate medication.   3/7  1658  Pt left additional message stating that her insurance no longer will cover the estradiol patch. She would like to discuss an alternate medication.   3/8  1130  Per chart review, notation from Dr. Erin Fulling on 11/20/15 stated that pt would need to be seen prior to any further refills of the patch. I called pt and advised that she needs an office visit with a doctor in order to determine the best course of treatment going forward. I offered appt tomorrow @ 0900. She voiced understanding and stated that she will try to get a ride. If not possible, she will call back to reschedule.

## 2016-11-02 ENCOUNTER — Encounter: Payer: Self-pay | Admitting: Family Medicine

## 2016-11-02 ENCOUNTER — Ambulatory Visit (INDEPENDENT_AMBULATORY_CARE_PROVIDER_SITE_OTHER): Payer: Medicare HMO | Admitting: Family Medicine

## 2016-11-02 DIAGNOSIS — M797 Fibromyalgia: Secondary | ICD-10-CM | POA: Diagnosis not present

## 2016-11-02 NOTE — Assessment & Plan Note (Signed)
Improving with vitamin D. No significant changes in medication at this time. Encourage patient to continue to monitor. We discussed icing regimen. Has topical anti-implant towards that needed. Follow-up again in 2 months

## 2016-11-02 NOTE — Progress Notes (Signed)
[facility] [address] [place], [place] [address] Phone: [phone] Subjective:    I'm seeing this patient by the request  of:  Myrlene Broker, MD   CC: Bilateral shoulder pain  IFO:[id number]  Catherine Gregory is a 71 y.o. female coming in with complaint of bilateral shoulder pain. Patient was seen previously. Patient was diagnosed with more of a fibromyalgia causing bilateral shoulder pain. Patient was take over-the-counter medications as well as once weekly vitamin D. Patient states that the vitamin D has been unbelievable. Has made significant improvement. Patient unfortunately 2 days after taking the medications that she notices some groin pain. Last 2 days and this completely resolves. Has had significant improvement in her depression, anxiety, mental clarity. Happy with all this.   patient is an autoimmune workup in all lab tests were negative. Positive ANA but 1:40 tighter.  Past Medical History:  Diagnosis Date  . Abdominal pain, chronic, generalized   . Abdominal pain, RUQ   . Abnormal weight loss   . Cachexia (HCC)   . Carpal tunnel syndrome   . Constipation   . COPD (chronic obstructive pulmonary disease) (HCC)   . Depression with anxiety   . Disturbance of skin sensation 08/13/2013  . Dysphagia, idiopathic   . Elevated antinuclear antibody (ANA) level   . GERD (gastroesophageal reflux disease)   . GERD (gastroesophageal reflux disease)   . Hiatal hernia   . Hyperlipidemia   . Idiopathic peripheral neuropathy   . Lymphadenopathy, inguinal   . Malnutrition (HCC)   . Microscopic hematuria   . Migraine   . Myalgia and myositis, unspecified 08/13/2013  . Nephrocalcinosis   . Renal calculus   . Right thyroid nodule   . Spleen enlarged   . Vitamin B12 deficiency   . Vitamin D deficiency    Past Surgical History:  Procedure Laterality Date  . ABDOMINAL HYSTERECTOMY  1998  . Bx of skin at outer ankle Bilateral April or May of  2016  . CHOLECYSTECTOMY  1978  . DILATION AND CURETTAGE OF UTERUS  1998  . NASAL SEPTUM SURGERY  1984  . TUBAL LIGATION Bilateral 1988   Social History   Social History  . Marital status: Single    Spouse name: N/A  . Number of children: 2  . Years of education: COLLEGE-2   Occupational History  . RETIRED    Social History Main Topics  . Smoking status: Never Smoker  . Smokeless tobacco: Never Used  . Alcohol use No     Comment: FORMER WINE OCCASIONALLY/ QUIT 1997  . Drug use: No  . Sexual activity: Not Currently   Other Topics Concern  . None   Social History Narrative   She currently lives with brother and sister-in-law.   She has two grown children.  She moved from [place] in 2014 where she lived with her daughter.   Highest level of education:  2 years of college   She went on disability due to sensitivity to "chemical problems"          Allergies  Allergen Reactions  . Levaquin [Levofloxacin In D5w] Nausea And Vomiting  . Prednisone Other (See Comments)    'like I had a knife in my stomach'  . Augmentin [Amoxicillin-Pot Clavulanate]     Severe abdominal pain  . Contrast Media [Iodinated Diagnostic Agents]     "skaking all over and passed out"  . Other Other (See Comments)    Fluorescien- body  aches  . Albuterol Other (See Comments)    'I go nuts'  . Doxycycline Nausea And Vomiting  . Ducodyl [Bisacodyl]   . Erythromycin Nausea Only  . Fentanyl And Related Other (See Comments)    Blood pressure drops   Family History  Problem Relation Age of Onset  . Heart Problems Father   . Hyperlipidemia Father   . Heart disease Father   . Hypertension Father   . Multiple sclerosis Sister   . Heart Problems Brother   . Hypertension Brother   . Heart Problems Brother   . Hypertension Brother   . Hypertension Brother   . Hypertension Brother   . Hypertension Brother   . Schizophrenia Mother   . Cancer Maternal Aunt     Colon    Past medical history,  social, surgical and family history all reviewed in electronic medical record.  No pertanent information unless stated regarding to the chief complaint.     Objective  Blood pressure 110/70, pulse 98, height 5\' 6"  (1.676 m), weight 103 lb (46.7 kg). Systems examined below as of 11/02/16   General: No apparent distress alert and oriented x3 mood and affect normal, dressed appropriately. .  MSK:  Non tender with full range of motion and good stability and symmetric strength and tone of shoulders, elbows, wrist, hip, knee and ankles bilaterally. Arthritic changes of multiple joints. Patient does have pain to light palpation that is out of proportion. Shoulder: Bilateral Inspection reveals no abnormalities, atrophy or asymmetry. Still mild discomfort noted. She no does have full range of motion and improvement in strength.      Impression and Recommendations:     This case required medical decision making of moderate complexity.      Note: This dictation was prepared with Dragon dictation along with smaller phrase technology. Any transcriptional errors that result from this process are unintentional.

## 2016-11-02 NOTE — Patient Instructions (Signed)
you are doing amazing.  Lets not change a thing! I will refill the vitamin D as needed Lets keep watching the hip pain  See me again In 8 weeks.

## 2016-11-04 ENCOUNTER — Ambulatory Visit (INDEPENDENT_AMBULATORY_CARE_PROVIDER_SITE_OTHER): Payer: Medicare HMO | Admitting: Obstetrics & Gynecology

## 2016-11-04 ENCOUNTER — Encounter: Payer: Self-pay | Admitting: Obstetrics & Gynecology

## 2016-11-04 DIAGNOSIS — Z79890 Hormone replacement therapy (postmenopausal): Secondary | ICD-10-CM | POA: Diagnosis not present

## 2016-11-04 DIAGNOSIS — Z5181 Encounter for therapeutic drug level monitoring: Secondary | ICD-10-CM | POA: Diagnosis not present

## 2016-11-04 MED ORDER — ESTRADIOL 0.025 MG/24HR TD PTWK: 0.0250 mg | MEDICATED_PATCH | TRANSDERMAL | 1 refills | Status: DC

## 2016-11-04 NOTE — Progress Notes (Signed)
   Subjective:    [name], female    DOB: [birth year], 70 y.o.   MRN: [id number]  HPI 71 yo DW P2 here today because she went to her phamacy and her insurance wouldn't pay for her climara generic patches. She has had her uterus, tubes, and ovaries removed. She still has a cervix.  She really needs the patch as she has a hiatal hernia and cannot take pills easily.   Review of Systems She reports that she has tried stopping her patches but that her rectocele "flares up".    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 She can have refills for 3 years without a visit.

## 2016-11-18 ENCOUNTER — Telehealth: Payer: Self-pay | Admitting: *Deleted

## 2016-11-18 NOTE — Telephone Encounter (Signed)
Called [facility] again, spoke with "Rosanne Ashing" who I spoke with earlier. Told him the problem was with insurance coverage, patient has been notified by her insurance that the medication would no longer be covered, is there a form that could be sent to [place] to get medication considered for coverage. Rosanne Ashing stated that the cannot do this until the medication is submitted for refill. At that time if it is rejected they will send a form for [place] to fill out and submit but it could not be done before that. Called patient back to explain this. There was no answer, message left stating I am calling with information, please return my call at the clinic.

## 2016-11-18 NOTE — Telephone Encounter (Signed)
[facility] and spoke to staff member who stated that a new prescription for estradiol patched had been received from Dr Marice Potter on 3/9. The patient had just refilled her prescription for the estradiol patch on 3/16. They were not aware of any problem. Called patient who stated that her insurance will no longer pay for the patches. They recommend that she switch to a pill or cream. However she has used to cream before and she gets better results with the patch, stated it is better at keeping her rectocele in check. She is not able to take pills due to a bad hiatal hernia. She received a letter stating the provider needs to submit and exception to coverage form. I told the patient I would do some research and get back to her. Understanding voiced.

## 2016-11-18 NOTE — Telephone Encounter (Signed)
Patient left message, states she is a pt of Dr H-S, having a problem getting her prescription again. Would like to talk with someone at the clinic.

## 2016-11-18 NOTE — Telephone Encounter (Signed)
Spoke with patient again and explained the conversation with [facility]. She said that her insurance company had sent her a similar form and suggested she get it to [place]. I told her to go on and do that so she did not have to wait till she was out of her medication. Patient said she would. I also suggested that when she did refill, if the issue wasn't resolved yet she might consider looking into a discount card. She could speak with her pharmacy about this. Patient thanked me for the suggestion and was appreciative of the assistance.

## 2016-12-16 ENCOUNTER — Encounter: Payer: Self-pay | Admitting: Internal Medicine

## 2016-12-16 ENCOUNTER — Ambulatory Visit (INDEPENDENT_AMBULATORY_CARE_PROVIDER_SITE_OTHER): Payer: Medicare HMO | Admitting: Internal Medicine

## 2016-12-16 VITALS — BP 120/70 | HR 80 | Temp 97.7°F | Resp 12 | Ht 66.0 in | Wt 108.0 lb

## 2016-12-16 DIAGNOSIS — L989 Disorder of the skin and subcutaneous tissue, unspecified: Secondary | ICD-10-CM

## 2016-12-16 NOTE — Assessment & Plan Note (Signed)
Referral to dermatology. Does not appear malignant on exam.

## 2016-12-16 NOTE — Progress Notes (Signed)
Pre visit review using our clinic review tool, if applicable. No additional management support is needed unless otherwise documented below in the visit note. 

## 2016-12-16 NOTE — Patient Instructions (Signed)

## 2016-12-16 NOTE — Progress Notes (Signed)
   Subjective:    [name], female    DOB: Feb 06, 1946, 71 y.o.   MRN: [id number]  HPI  The patient is a 71 YO female coming in for skin lesion near her eyes. She has a dermatologist and did not call them about her. She thought her insurance required a referral and so she came in. She has some dry skin around the area. She does use sunscreen. She is worried because her brother just had a skin cancer taken off around his eyes and think makes her worried.   Review of Systems  Constitutional: Negative.   Respiratory: Negative.   Cardiovascular: Negative.   Gastrointestinal: Negative.   Skin: Positive for color change.  Neurological: Negative.       Objective:   Physical Exam  Constitutional: She is oriented to person, place, and time. She appears well-developed and well-nourished.  Cardiovascular: Normal rate and regular rhythm.   Pulmonary/Chest: Effort normal and breath sounds normal.  Abdominal: Soft.  Musculoskeletal: She exhibits no edema.  Neurological: She is alert and oriented to person, place, and time.  Skin: Skin is warm and dry.  Several tan spots around her left eye on exam   Vitals:   12/16/16 1440  BP: 120/70  Pulse: 80  Resp: 12  Temp: 97.7 F (36.5 C)  TempSrc: Oral  SpO2: 98%  Weight: 108 lb (49 kg)  Height: 5\' 6"  (1.676 m)      Assessment & Plan:

## 2016-12-18 ENCOUNTER — Other Ambulatory Visit: Payer: Self-pay | Admitting: Family Medicine

## 2016-12-19 ENCOUNTER — Other Ambulatory Visit: Payer: Self-pay | Admitting: Obstetrics & Gynecology

## 2016-12-19 DIAGNOSIS — Z1231 Encounter for screening mammogram for malignant neoplasm of breast: Secondary | ICD-10-CM

## 2016-12-21 ENCOUNTER — Telehealth: Payer: Self-pay | Admitting: *Deleted

## 2016-12-21 NOTE — Telephone Encounter (Signed)
Pt left message yesterday stating that she had called on 4/20 and left message regarding refill request of her estradiol patch.  She has not received a return call.

## 2016-12-22 NOTE — Telephone Encounter (Signed)
[facility] and initiated prior authorization for the estriadol patches. Confirmation [id number]. Called patient back, left message stating her prior authorization request has been submitted, and they will have an answer in 48-72 hours. This will be faxed to her pharmacy. If she has any questions, please call back at the clinic.

## 2016-12-22 NOTE — Telephone Encounter (Signed)
Called patient and spoke with her about her precription which needs a prior authorization from [facility]. She said she was told by [facility] that they were faxing [place] a prior authorization request, however I was unable to locate this form. She is currently without this medication. Told patient I would call [employer] and speak with them, then let her know what what transpired. Patient voiced understanding.

## 2016-12-27 ENCOUNTER — Ambulatory Visit (INDEPENDENT_AMBULATORY_CARE_PROVIDER_SITE_OTHER): Payer: Medicare HMO | Admitting: Family Medicine

## 2016-12-27 ENCOUNTER — Encounter: Payer: Self-pay | Admitting: Family Medicine

## 2016-12-27 DIAGNOSIS — M797 Fibromyalgia: Secondary | ICD-10-CM

## 2016-12-27 NOTE — Patient Instructions (Addendum)
Good to see you  I would do the every other week for the vitamin D Don't change a thing otherwise.  Consider 1/2 oz of apple cider vinegar with the taking of the medicine per pharmacist.  Stay active Send me a message after next dose and tell me how it does

## 2016-12-27 NOTE — Progress Notes (Signed)
[facility] [address] [place], [place] [address] Phone: [phone] Subjective:    I'm seeing this patient by the request  of:    CC: back and abdominal pain    BWL:[id number]  Catherine Gregory is a 71 y.o. female coming in with complaint of Back and abdominal pain. Patient feels that this is secondary to the vitamin D. Patient has been taking this and is no significant improvement in muscle strength including the core as well as her shoulders. Patient though states that the vitamin D every time she doesn't seems to irritate her stomach. Patient had significant for this previously. Multiple EGDs, colonoscopies, as well as even small bowel videography. Patient denies any bowel or bladder changes. Denies any change in stool. States that it is just more of an uncomfortable feeling noted.     Past Medical History:  Diagnosis Date  . Abdominal pain, chronic, generalized   . Abdominal pain, RUQ   . Abnormal weight loss   . Cachexia (HCC)   . Carpal tunnel syndrome   . Constipation   . COPD (chronic obstructive pulmonary disease) (HCC)   . Depression with anxiety   . Disturbance of skin sensation 08/13/2013  . Dysphagia, idiopathic   . Elevated antinuclear antibody (ANA) level   . GERD (gastroesophageal reflux disease)   . GERD (gastroesophageal reflux disease)   . Hiatal hernia   . Hyperlipidemia   . Idiopathic peripheral neuropathy   . Lymphadenopathy, inguinal   . Malnutrition (HCC)   . Microscopic hematuria   . Migraine   . Myalgia and myositis, unspecified 08/13/2013  . Nephrocalcinosis   . Renal calculus   . Right thyroid nodule   . Spleen enlarged   . Vitamin B12 deficiency   . Vitamin D deficiency    Past Surgical History:  Procedure Laterality Date  . ABDOMINAL HYSTERECTOMY  1998  . Bx of skin at outer ankle Bilateral April or May of 2016  . CHOLECYSTECTOMY  1978  . DILATION AND CURETTAGE OF UTERUS  1998  . NASAL SEPTUM SURGERY   1984  . TUBAL LIGATION Bilateral 1988   Social History   Social History  . Marital status: Single    Spouse name: N/A  . Number of children: 2  . Years of education: COLLEGE-2   Occupational History  . RETIRED    Social History Main Topics  . Smoking status: Never Smoker  . Smokeless tobacco: Never Used  . Alcohol use No     Comment: FORMER WINE OCCASIONALLY/ QUIT 1997  . Drug use: No  . Sexual activity: Not Currently   Other Topics Concern  . None   Social History Narrative   She currently lives with brother and sister-in-law.   She has two grown children.  She moved from [place] in 2014 where she lived with her daughter.   Highest level of education:  2 years of college   She went on disability due to sensitivity to "chemical problems"          Allergies  Allergen Reactions  . Levaquin [Levofloxacin In D5w] Nausea And Vomiting  . Prednisone Other (See Comments)    'like I had a knife in my stomach'  . Augmentin [Amoxicillin-Pot Clavulanate]     Severe abdominal pain  . Contrast Media [Iodinated Diagnostic Agents]     "skaking all over and passed out"  . Other Other (See Comments)    Fluorescien- body aches  . Albuterol Other (  See Comments)    'I go nuts'  . Doxycycline Nausea And Vomiting  . Ducodyl [Bisacodyl]   . Erythromycin Nausea Only  . Fentanyl And Related Other (See Comments)    Blood pressure drops   Family History  Problem Relation Age of Onset  . Heart Problems Father   . Hyperlipidemia Father   . Heart disease Father   . Hypertension Father   . Multiple sclerosis Sister   . Heart Problems Brother   . Hypertension Brother   . Heart Problems Brother   . Hypertension Brother   . Hypertension Brother   . Hypertension Brother   . Hypertension Brother   . Schizophrenia Mother   . Cancer Maternal Aunt     Colon    Past medical history, social, surgical and family history all reviewed in electronic medical record.  No pertanent information  unless stated regarding to the chief complaint.   Review of systems is positive for multiple problems  Objective  Blood pressure 120/72, pulse 66, height 5\' 6"  (1.676 m), weight 106 lb (48.1 kg). Systems examined below as of 12/27/16   General: No apparent distress alert and oriented x3 mood and affect normal, dressed appropriately.  HEENT: Pupils equal, extraocular movements intact  Respiratory: Patient's speak in full sentences and does not appear short of breath  Cardiovascular: No lower extremity edema, non tender, no erythema  Skin: Warm dry intact with no signs of infection or rash on extremities or on axial skeleton.  Abdomen: Soft . Tender but no masses noted. No guarding volunteer or involuntary noted. Negative Murphy sign. Scar from previous gallbladder surgery noted. Neuro: Cranial nerves II through XII are intact, neurovascularly intact in all extremities with 2+ DTRs and 2+ pulses.  Lymph: No lymphadenopathy of posterior or anterior cervical chain or axillae bilaterally.  Gait normal with good balance and coordination.  MSK:  Non tender with full range of motion and good stability and symmetric strength and tone of shoulders, elbows, wrist, hip, knee and ankles bilaterally.      Impression and Recommendations:     This case required medical decision making of moderate complexity.      Note: This dictation was prepared with Dragon dictation along with smaller phrase technology. Any transcriptional errors that result from this process are unintentional.

## 2016-12-27 NOTE — Assessment & Plan Note (Signed)
I believe that this as well as the abdominal pain is likely more secondary to the fibromyalgia. Patient wants to continue the vitamin D and we will try every other week dosing. We discussed different types of vitamin D that may not be soy-based anything better. We discussed icing regimen, discussed monitoring diet and see if there is any other contribute intact. Patient given the option of a PPI which patient declined. Numerous other options are given which patient also declined. Patient will come back and see me again in 6-12 weeks.  Spent  25 minutes with patient face-to-face and had greater than 50% of counseling including as described above in assessment and plan.

## 2017-01-02 DIAGNOSIS — L821 Other seborrheic keratosis: Secondary | ICD-10-CM | POA: Diagnosis not present

## 2017-01-02 DIAGNOSIS — L820 Inflamed seborrheic keratosis: Secondary | ICD-10-CM | POA: Diagnosis not present

## 2017-01-02 DIAGNOSIS — I781 Nevus, non-neoplastic: Secondary | ICD-10-CM | POA: Diagnosis not present

## 2017-01-09 ENCOUNTER — Encounter: Payer: Self-pay | Admitting: Family Medicine

## 2017-01-10 ENCOUNTER — Other Ambulatory Visit: Payer: Self-pay

## 2017-01-10 DIAGNOSIS — M2550 Pain in unspecified joint: Secondary | ICD-10-CM

## 2017-01-11 ENCOUNTER — Other Ambulatory Visit (INDEPENDENT_AMBULATORY_CARE_PROVIDER_SITE_OTHER): Payer: Medicare HMO

## 2017-01-11 DIAGNOSIS — M2550 Pain in unspecified joint: Secondary | ICD-10-CM | POA: Diagnosis not present

## 2017-01-11 LAB — VITAMIN D 25 HYDROXY (VIT D DEFICIENCY, FRACTURES): VITD: 64.67 ng/mL (ref 30.00–100.00)

## 2017-01-24 ENCOUNTER — Ambulatory Visit
Admission: RE | Admit: 2017-01-24 | Discharge: 2017-01-24 | Disposition: A | Discharge status: Home / Self Care | Payer: Medicare HMO | Source: Ambulatory Visit | Attending: Obstetrics & Gynecology | Admitting: Obstetrics & Gynecology

## 2017-01-24 DIAGNOSIS — Z1231 Encounter for screening mammogram for malignant neoplasm of breast: Secondary | ICD-10-CM | POA: Diagnosis not present

## 2017-02-01 ENCOUNTER — Other Ambulatory Visit: Payer: Self-pay

## 2017-02-01 DIAGNOSIS — Z5181 Encounter for therapeutic drug level monitoring: Secondary | ICD-10-CM

## 2017-02-01 DIAGNOSIS — Z79890 Hormone replacement therapy (postmenopausal): Principal | ICD-10-CM

## 2017-02-01 MED ORDER — ESTRADIOL 0.025 MG/24HR TD PTWK
0.0250 mg | MEDICATED_PATCH | TRANSDERMAL | 3 refills | Status: DC
Start: 2017-02-01 — End: 2018-03-05

## 2017-02-01 NOTE — Telephone Encounter (Signed)
[facility] from [facility] that pt needs [facility] supply refill on her Estradiol patches.  Per Dr. Marice Potter, pt can have requested refill.

## 2017-08-10 DIAGNOSIS — M7751 Other enthesopathy of right foot: Secondary | ICD-10-CM | POA: Diagnosis not present

## 2017-08-10 DIAGNOSIS — L600 Ingrowing nail: Secondary | ICD-10-CM | POA: Diagnosis not present

## 2017-08-15 ENCOUNTER — Encounter: Payer: Self-pay | Admitting: Internal Medicine

## 2017-08-15 ENCOUNTER — Ambulatory Visit (INDEPENDENT_AMBULATORY_CARE_PROVIDER_SITE_OTHER): Payer: Medicare HMO | Admitting: Internal Medicine

## 2017-08-15 VITALS — BP 112/78 | HR 105 | Temp 97.7°F | Ht 66.0 in | Wt 104.0 lb

## 2017-08-15 DIAGNOSIS — G901 Familial dysautonomia [Riley-Day]: Secondary | ICD-10-CM | POA: Diagnosis not present

## 2017-08-15 DIAGNOSIS — E440 Moderate protein-calorie malnutrition: Secondary | ICD-10-CM

## 2017-08-15 NOTE — Patient Instructions (Signed)
We are checking the blood work today and will call you back with the results.   It is possible that the low body weight could be causing some of the temperature changes.

## 2017-08-15 NOTE — Progress Notes (Signed)
   Subjective:    [name], female    DOB: Sep 22, 1945, 71 y.o.   MRN: [id number]  HPI The patient is a 71 YO female coming in for concerns about her body temperature. She feels that her temperature is low and does not regulate well. She feels as this is worsening in the last several months. Has been present for years. She also feels like she does not sweat at all anymore and this alarms her. Again going on for several years. She is down several pounds from last visit and she states that she always looses weight over the summer and gains a couple of pounds over the winter. This is from more activity. She feels that her temperature problems impact her on a daily basis. Took high dose vitamin D for her shoulder and did not feel that being on this affected her temperature. She did not complete the course.   Review of Systems  Constitutional: Negative.   HENT: Negative.   Eyes: Negative.   Respiratory: Negative for cough, chest tightness and shortness of breath.   Cardiovascular: Negative for chest pain, palpitations and leg swelling.  Gastrointestinal: Negative for abdominal distention, abdominal pain, constipation, diarrhea, nausea and vomiting.  Endocrine: Positive for cold intolerance and heat intolerance.  Musculoskeletal: Negative.   Skin: Negative.   Neurological: Negative.   Psychiatric/Behavioral: Negative.       Objective:   Physical Exam  Constitutional: She is oriented to person, place, and time. She appears well-developed and well-nourished.  thin  HENT:  Head: Normocephalic and atraumatic.  Eyes: EOM are normal.  Neck: Normal range of motion.  Cardiovascular: Normal rate and regular rhythm.  Pulmonary/Chest: Effort normal and breath sounds normal. No respiratory distress. She has no wheezes. She has no rales.  Abdominal: Soft. Bowel sounds are normal. She exhibits no distension. There is no tenderness. There is no rebound.  Musculoskeletal: She exhibits no  edema.  Neurological: She is alert and oriented to person, place, and time. Coordination normal.  Skin: Skin is warm and dry.  Psychiatric: She has a normal mood and affect.   Vitals:   08/15/17 1259  BP: 112/78  Pulse: (!) 105  Temp: 97.7 F (36.5 C)  TempSrc: Oral  SpO2: 99%  Weight: 104 lb (47.2 kg)  Height: 5\' 6"  (1.676 m)      Assessment & Plan:  Visit time 25 minutes: greater than 50% of that time was spent in face to face counseling and coordination of care with the patient. Counseled about her weight and how being underweight could be contributing to her temperature problems and talked through her current diet in great detail as well as opportunities for adding additional calories to her diet to help with her weight.

## 2017-08-16 ENCOUNTER — Other Ambulatory Visit (INDEPENDENT_AMBULATORY_CARE_PROVIDER_SITE_OTHER): Payer: Medicare HMO

## 2017-08-16 DIAGNOSIS — G901 Familial dysautonomia [Riley-Day]: Secondary | ICD-10-CM

## 2017-08-16 DIAGNOSIS — E46 Unspecified protein-calorie malnutrition: Secondary | ICD-10-CM | POA: Insufficient documentation

## 2017-08-16 LAB — COMPREHENSIVE METABOLIC PANEL
ALT: 13 U/L (ref 0–35)
AST: 16 U/L (ref 0–37)
Albumin: 4 g/dL (ref 3.5–5.2)
Alkaline Phosphatase: 80 U/L (ref 39–117)
BILIRUBIN TOTAL: 0.4 mg/dL (ref 0.2–1.2)
BUN: 13 mg/dL (ref 6–23)
CALCIUM: 9.4 mg/dL (ref 8.4–10.5)
CO2: 28 meq/L (ref 19–32)
Chloride: 102 mEq/L (ref 96–112)
Creatinine, Ser: 0.45 mg/dL (ref 0.40–1.20)
GFR: 145.58 mL/min (ref >60.00)
Glucose, Bld: 94 mg/dL (ref 70–99)
POTASSIUM: 4.1 meq/L (ref 3.5–5.1)
Sodium: 136 mEq/L (ref 135–145)
Total Protein: 7.3 g/dL (ref 6.0–8.3)

## 2017-08-16 LAB — LIPID PANEL
CHOL/HDL RATIO: 2
CHOLESTEROL: 172 mg/dL (ref 0–200)
HDL: 84.9 mg/dL (ref >39.00)
LDL CALC: 65 mg/dL (ref 0–99)
NonHDL: 87.42
TRIGLYCERIDES: 114 mg/dL (ref 0.0–149.0)
VLDL: 22.8 mg/dL (ref 0.0–40.0)

## 2017-08-16 LAB — VITAMIN B12: Vitamin B-12: 497 pg/mL (ref 211–911)

## 2017-08-16 LAB — T4, FREE: Free T4: 0.91 ng/dL (ref 0.60–1.60)

## 2017-08-16 LAB — CBC
HEMATOCRIT: 43.7 % (ref 36.0–46.0)
HEMOGLOBIN: 14.3 g/dL (ref 12.0–15.0)
MCHC: 32.8 g/dL (ref 30.0–36.0)
MCV: 98 fl (ref 78.0–100.0)
PLATELETS: 232 10*3/uL (ref 150.0–400.0)
RBC: 4.46 Mil/uL (ref 3.87–5.11)
RDW: 14.1 % (ref 11.5–15.5)
WBC: 6 10*3/uL (ref 4.0–10.5)

## 2017-08-16 LAB — CORTISOL: CORTISOL PLASMA: 3.9 ug/dL

## 2017-08-16 LAB — HEMOGLOBIN A1C: Hgb A1c MFr Bld: 5.7 % (ref 4.6–6.5)

## 2017-08-16 LAB — TSH: TSH: 2.84 u[IU]/mL (ref 0.35–4.50)

## 2017-08-16 LAB — VITAMIN D 25 HYDROXY (VIT D DEFICIENCY, FRACTURES): VITD: 29.48 ng/mL — ABNORMAL LOW (ref 30.00–100.00)

## 2017-08-16 NOTE — Assessment & Plan Note (Signed)
BMI 16 and dropping since last visit. She does sound as though she is getting nutrients in her diet but may be lacking in the total number of calories. We talked extensively about how it is possible to add more calories and still eat healthy and not just add ice cream or sodas which she was extremely against. Offered nutrition referral which she did not accept.

## 2017-08-16 NOTE — Assessment & Plan Note (Signed)
Some temperature problems for the patient. I suspect that some of these are related to low BMI and talked to her today about nutritionist. She is insistent that she will only eat healthy and does not feel it is possible to eat healthy and gain weight. She does not want to talk to nutrition again. We did discuss that her weight is too low and she should try to add nutrition to her diet an some higher calorie options. She uses nuts and nut butters already for high density foods but we talked about eating larger portions to help with weight gain.Checking labs today although they have all been checked in the past to rule out underlying metabolic problems including thyroid, CBC, CMP, B12, vitamin D, cortisol.

## 2017-09-19 DIAGNOSIS — G603 Idiopathic progressive neuropathy: Secondary | ICD-10-CM | POA: Diagnosis not present

## 2017-09-19 DIAGNOSIS — M5412 Radiculopathy, cervical region: Secondary | ICD-10-CM | POA: Diagnosis not present

## 2017-09-19 DIAGNOSIS — M5417 Radiculopathy, lumbosacral region: Secondary | ICD-10-CM | POA: Diagnosis not present

## 2017-09-19 DIAGNOSIS — Z87898 Personal history of other specified conditions: Secondary | ICD-10-CM | POA: Diagnosis not present

## 2017-09-28 DIAGNOSIS — L600 Ingrowing nail: Secondary | ICD-10-CM | POA: Diagnosis not present

## 2017-10-04 DIAGNOSIS — Z87898 Personal history of other specified conditions: Secondary | ICD-10-CM | POA: Diagnosis not present

## 2017-10-04 DIAGNOSIS — M5417 Radiculopathy, lumbosacral region: Secondary | ICD-10-CM | POA: Diagnosis not present

## 2017-10-04 DIAGNOSIS — R404 Transient alteration of awareness: Secondary | ICD-10-CM | POA: Diagnosis not present

## 2017-10-05 DIAGNOSIS — H5203 Hypermetropia, bilateral: Secondary | ICD-10-CM | POA: Diagnosis not present

## 2017-10-05 DIAGNOSIS — H524 Presbyopia: Secondary | ICD-10-CM | POA: Diagnosis not present

## 2017-10-05 DIAGNOSIS — H2513 Age-related nuclear cataract, bilateral: Secondary | ICD-10-CM | POA: Diagnosis not present

## 2017-10-06 ENCOUNTER — Other Ambulatory Visit: Payer: Self-pay | Admitting: Specialist

## 2017-10-06 DIAGNOSIS — R569 Unspecified convulsions: Secondary | ICD-10-CM

## 2017-10-06 DIAGNOSIS — R419 Unspecified symptoms and signs involving cognitive functions and awareness: Secondary | ICD-10-CM

## 2017-10-07 ENCOUNTER — Other Ambulatory Visit: Payer: Self-pay | Admitting: Family Medicine

## 2017-10-16 ENCOUNTER — Ambulatory Visit
Admission: RE | Admit: 2017-10-16 | Discharge: 2017-10-16 | Disposition: A | Discharge status: Home / Self Care | Payer: Medicare HMO | Source: Ambulatory Visit | Attending: Specialist | Admitting: Specialist

## 2017-10-16 DIAGNOSIS — R569 Unspecified convulsions: Secondary | ICD-10-CM

## 2017-10-16 DIAGNOSIS — R419 Unspecified symptoms and signs involving cognitive functions and awareness: Secondary | ICD-10-CM

## 2017-10-19 DIAGNOSIS — M545 Low back pain: Secondary | ICD-10-CM | POA: Diagnosis not present

## 2017-10-19 DIAGNOSIS — M5417 Radiculopathy, lumbosacral region: Secondary | ICD-10-CM | POA: Diagnosis not present

## 2017-10-19 DIAGNOSIS — G510 Bell's palsy: Secondary | ICD-10-CM | POA: Diagnosis not present

## 2017-10-19 DIAGNOSIS — G5603 Carpal tunnel syndrome, bilateral upper limbs: Secondary | ICD-10-CM | POA: Diagnosis not present

## 2017-10-19 DIAGNOSIS — M542 Cervicalgia: Secondary | ICD-10-CM | POA: Diagnosis not present

## 2017-10-19 DIAGNOSIS — M5412 Radiculopathy, cervical region: Secondary | ICD-10-CM | POA: Diagnosis not present

## 2017-10-19 DIAGNOSIS — Z87898 Personal history of other specified conditions: Secondary | ICD-10-CM | POA: Diagnosis not present

## 2017-10-19 DIAGNOSIS — M5481 Occipital neuralgia: Secondary | ICD-10-CM | POA: Diagnosis not present

## 2017-10-19 DIAGNOSIS — G603 Idiopathic progressive neuropathy: Secondary | ICD-10-CM | POA: Diagnosis not present

## 2017-12-26 ENCOUNTER — Other Ambulatory Visit: Payer: Self-pay | Admitting: Family Medicine

## 2017-12-26 NOTE — Telephone Encounter (Signed)
Refill denied. Pt has completed course of treatment.  

## 2018-01-08 DIAGNOSIS — R05 Cough: Secondary | ICD-10-CM | POA: Diagnosis not present

## 2018-01-08 DIAGNOSIS — Z881 Allergy status to other antibiotic agents status: Secondary | ICD-10-CM | POA: Diagnosis not present

## 2018-01-08 DIAGNOSIS — G9009 Other idiopathic peripheral autonomic neuropathy: Secondary | ICD-10-CM | POA: Diagnosis not present

## 2018-01-08 DIAGNOSIS — Z91041 Radiographic dye allergy status: Secondary | ICD-10-CM | POA: Diagnosis not present

## 2018-01-08 DIAGNOSIS — J209 Acute bronchitis, unspecified: Secondary | ICD-10-CM | POA: Diagnosis not present

## 2018-01-08 DIAGNOSIS — Z888 Allergy status to other drugs, medicaments and biological substances status: Secondary | ICD-10-CM | POA: Diagnosis not present

## 2018-01-08 DIAGNOSIS — Z885 Allergy status to narcotic agent status: Secondary | ICD-10-CM | POA: Diagnosis not present

## 2018-01-08 DIAGNOSIS — R569 Unspecified convulsions: Secondary | ICD-10-CM | POA: Diagnosis not present

## 2018-01-08 DIAGNOSIS — M797 Fibromyalgia: Secondary | ICD-10-CM | POA: Diagnosis not present

## 2018-01-25 ENCOUNTER — Telehealth: Payer: Self-pay | Admitting: Emergency Medicine

## 2018-01-25 NOTE — Telephone Encounter (Signed)
Called patient to schedule AWV. Patient will cal back to schedule at later date.

## 2018-02-14 DIAGNOSIS — Z9049 Acquired absence of other specified parts of digestive tract: Secondary | ICD-10-CM | POA: Diagnosis not present

## 2018-02-14 DIAGNOSIS — Z90710 Acquired absence of both cervix and uterus: Secondary | ICD-10-CM | POA: Diagnosis not present

## 2018-02-14 DIAGNOSIS — Z888 Allergy status to other drugs, medicaments and biological substances status: Secondary | ICD-10-CM | POA: Diagnosis not present

## 2018-02-14 DIAGNOSIS — J449 Chronic obstructive pulmonary disease, unspecified: Secondary | ICD-10-CM | POA: Diagnosis not present

## 2018-02-14 DIAGNOSIS — G629 Polyneuropathy, unspecified: Secondary | ICD-10-CM | POA: Diagnosis not present

## 2018-02-14 DIAGNOSIS — Z881 Allergy status to other antibiotic agents status: Secondary | ICD-10-CM | POA: Diagnosis not present

## 2018-02-14 DIAGNOSIS — R079 Chest pain, unspecified: Secondary | ICD-10-CM | POA: Diagnosis not present

## 2018-02-14 DIAGNOSIS — M797 Fibromyalgia: Secondary | ICD-10-CM | POA: Diagnosis not present

## 2018-02-16 ENCOUNTER — Telehealth: Payer: Self-pay

## 2018-02-16 NOTE — Telephone Encounter (Signed)
We need fax number to fax referral to heart specialist. Do we know why she needs to see heart specialist so we have the right reason for the referral? We can place referral for that. Same for mammogram we would need to know where she wants this done and fax number and can likely fax them the information.

## 2018-02-16 NOTE — Telephone Encounter (Signed)
Patient wants a referral for mammogram missed her routine mammogram since having to go out of town is now having pain in her right breast where lasted 3-4 days went away and then came back.  Needs a referral to the cardiologist because at the ED in [place] was told she needed to go to this cardiologist and was told she needed a referral from her PCP.   Tried to get another PCP in [place] since she will be there awhile and they did not except medicare and patient is wondering if there was anyway her PCP can put the referrals in for her.  Visiting daughter in [place] since she was in a car accident and is recovering slowly and patient does not have a return date in mind yet.

## 2018-02-16 NOTE — Telephone Encounter (Signed)
Patient is going to be calling us back with the fax number of the heart specialist and later with the mammogram information. Patient is needing a referral to heart specialist for erratic heart rate.

## 2018-02-16 NOTE — Telephone Encounter (Signed)
Copied from CRM [id number]. Topic: Referral - Request >> Feb 16, 2018 11:41 AM Jay Schlichter wrote: Reason for CRM: [facility] called - they need to have referral for cardiology.  Pt is visiting in [place] and has to see cardiologist.  Her ins requires referral  Cb is [phone] ext [phone], letty Please call if any questions Pt has appt for 02/20/18    >> Feb 16, 2018  1:04 PM Tamela Oddi wrote: Patient called to get the status on a request for a referral for her to get a mammogram since she is out of state [facility]).  Please call to advise what she should do.  [phone].

## 2018-02-16 NOTE — Telephone Encounter (Signed)
Patient called back with Fax:[phone] for the heart center. She will call back with fax for imaging center RE mammo.

## 2018-02-19 ENCOUNTER — Telehealth: Payer: Self-pay | Admitting: Internal Medicine

## 2018-02-19 DIAGNOSIS — I499 Cardiac arrhythmia, unspecified: Secondary | ICD-10-CM

## 2018-02-19 NOTE — Telephone Encounter (Signed)
Catherine Gregory is calling back and states that it is urgent that she gets a referral because she states her appointment is 02/20/18. Please advise.

## 2018-02-19 NOTE — Telephone Encounter (Signed)
Referral faxed

## 2018-02-19 NOTE — Telephone Encounter (Signed)
Patient states she called the heart specialist and they advised her that they have not received the referral from Dr Okey Dupre. Her appointment is tomorrow at 11am. Please advise.

## 2018-02-19 NOTE — Telephone Encounter (Signed)
Copied from CRM [id number]. Topic: Referral - Request >> Feb 16, 2018 11:41 AM Jay Schlichter wrote: Reason for CRM: [facility] called - they need to have referral for cardiology.  Pt is visiting in [place] and has to see cardiologist.  Her ins requires referral  Cb is [phone] ext [phone], letty Please call if any questions Pt has appt for 02/20/18

## 2018-02-19 NOTE — Telephone Encounter (Signed)
Referral placed. We were waiting on the fax number.

## 2018-02-19 NOTE — Telephone Encounter (Signed)
Patient called back with Fax:[phone] for the heart center.

## 2018-02-19 NOTE — Telephone Encounter (Signed)
The fax number for [facility] for her mammo is [phone]

## 2018-02-20 DIAGNOSIS — R0789 Other chest pain: Secondary | ICD-10-CM | POA: Diagnosis not present

## 2018-02-22 ENCOUNTER — Telehealth: Payer: Self-pay

## 2018-02-22 NOTE — Telephone Encounter (Signed)
I have already faxed and had confirmed by patient that cardiologist had received referral but referral was resent to new fax that was given below.

## 2018-02-22 NOTE — Telephone Encounter (Signed)
Copied from CRM [id number]. Topic: General - Other >> Feb 19, 2018  4:57 PM Marylen Ponto wrote: Reason for CRM: Letty with [facility] request a call back. Pt is visiting her daughter in [place] and went to the ER and now needs a referral to be seen by a cardiologist per insurance referral needed. Cb# [phone] Ext. [phone]   >> Feb 19, 2018  5:04 PM Marquis Buggy A wrote: I am not sure how we would handled this?  >> Feb 22, 2018 11:34 AM Floria Raveling A wrote: New fax number per pt is [phone]   Per pt this is the correct fax number

## 2018-02-27 ENCOUNTER — Telehealth: Payer: Self-pay | Admitting: Obstetrics & Gynecology

## 2018-02-27 NOTE — Telephone Encounter (Signed)
Patient called, she is in [place], [place] visiting her daughter for a prolonged visit.  She has had right breast pain that did send her to the ER.  She is requesting that we order a mammogram for [facility] at at [facility] [place], [place].  Her plan is to return here, but wants this addressed as soon as possible.    Will route to Dr. Erin Fulling for orders and follow up with patient.

## 2018-03-05 ENCOUNTER — Other Ambulatory Visit: Payer: Self-pay | Admitting: Obstetrics & Gynecology

## 2018-03-05 DIAGNOSIS — Z5181 Encounter for therapeutic drug level monitoring: Secondary | ICD-10-CM

## 2018-03-05 DIAGNOSIS — Z79890 Hormone replacement therapy (postmenopausal): Principal | ICD-10-CM

## 2018-03-07 DIAGNOSIS — R0789 Other chest pain: Secondary | ICD-10-CM | POA: Diagnosis not present

## 2018-03-08 DIAGNOSIS — M797 Fibromyalgia: Secondary | ICD-10-CM | POA: Diagnosis not present

## 2018-03-08 DIAGNOSIS — Z1231 Encounter for screening mammogram for malignant neoplasm of breast: Secondary | ICD-10-CM | POA: Diagnosis not present

## 2018-03-08 DIAGNOSIS — N644 Mastodynia: Secondary | ICD-10-CM | POA: Diagnosis not present

## 2018-03-08 DIAGNOSIS — G500 Trigeminal neuralgia: Secondary | ICD-10-CM | POA: Diagnosis not present

## 2018-03-08 DIAGNOSIS — N816 Rectocele: Secondary | ICD-10-CM | POA: Diagnosis not present

## 2018-03-08 DIAGNOSIS — R05 Cough: Secondary | ICD-10-CM | POA: Diagnosis not present

## 2018-03-08 DIAGNOSIS — R499 Unspecified voice and resonance disorder: Secondary | ICD-10-CM | POA: Diagnosis not present

## 2018-03-09 DIAGNOSIS — Z1231 Encounter for screening mammogram for malignant neoplasm of breast: Secondary | ICD-10-CM | POA: Diagnosis not present

## 2018-03-12 DIAGNOSIS — R002 Palpitations: Secondary | ICD-10-CM | POA: Diagnosis not present

## 2018-03-12 DIAGNOSIS — R0789 Other chest pain: Secondary | ICD-10-CM | POA: Diagnosis not present

## 2018-03-19 ENCOUNTER — Telehealth: Payer: Self-pay | Admitting: *Deleted

## 2018-03-19 DIAGNOSIS — R002 Palpitations: Secondary | ICD-10-CM | POA: Diagnosis not present

## 2018-03-19 DIAGNOSIS — I499 Cardiac arrhythmia, unspecified: Secondary | ICD-10-CM

## 2018-03-19 NOTE — Telephone Encounter (Signed)
What kind of doctor and for what is she needing referral for?

## 2018-03-19 NOTE — Telephone Encounter (Signed)
Copied from CRM [id number]. Topic: Referral - Request >> Mar 19, 2018  1:51 PM Alexander Bergeron B wrote: Reason for CRM: Dr. Iverson Alamin office called b/c the pt has a f/u visit and they are needing a new referral sent; contact the office @ [phone] ext [phone] if needed   >> Mar 19, 2018  1:54 PM Rica Koyanagi, Barbee Cough wrote: Pt is being seen this Wednesday

## 2018-03-19 NOTE — Telephone Encounter (Signed)
Tried calling # that was given not able to get through, but MD is a Cardiovascular in [place].  Per chart they called on 02/19/18, which Luanna Cole spoke w/patient and office 02/22/18 (see previous note). Will hold for Briana for tomorrow when she return...Raechel Chute   Josetta Huddle, CMA       3:18 PM  Note    I have already faxed and had confirmed by patient that cardiologist had received referral but referral was resent to new fax that was given below

## 2018-03-20 DIAGNOSIS — N644 Mastodynia: Secondary | ICD-10-CM | POA: Diagnosis not present

## 2018-03-20 NOTE — Telephone Encounter (Signed)
That last note was for the last cardiovascular referral. Called patient and this next office visit is a follow up for patient from wearing 48 hour heart monitor for irregular heart beat. I could not get ahold of the [facility], but they are needing a new referral for the follow up is what I am gathering.

## 2018-03-20 NOTE — Telephone Encounter (Signed)
Fine, just place new referral same as old and refax. Thanks.

## 2018-03-20 NOTE — Telephone Encounter (Signed)
Referral faxed to [facility]

## 2018-03-21 DIAGNOSIS — R0789 Other chest pain: Secondary | ICD-10-CM | POA: Diagnosis not present

## 2022-01-13 ENCOUNTER — Ambulatory Visit
Admission: EM | Admit: 2022-01-13 | Discharge: 2022-01-13 | Disposition: A | Discharge status: Home / Self Care | Payer: Medicare HMO | Attending: Family Medicine | Admitting: Family Medicine

## 2022-01-13 DIAGNOSIS — E440 Moderate protein-calorie malnutrition: Secondary | ICD-10-CM | POA: Diagnosis not present

## 2022-01-13 DIAGNOSIS — R531 Weakness: Secondary | ICD-10-CM

## 2022-01-13 NOTE — Discharge Instructions (Addendum)
Please see a general doctor soon, in the next 2-3 weeks.   Staff will call you if anything is extremely abnormal on your lab.  Try drinking ensure or boost daily.  It is ok to take a stool softener daily.

## 2022-01-13 NOTE — ED Triage Notes (Signed)
Patient presents to Urgent Care with complaints of fatigue since a couple of years, but has been gotten a lot worse recently.  Pt reports pain on sacral area she is concerned may be a bed sore but she is unable to see it.   Decreases sense of taste explains food taste rotten to her, pt reports this has been new over the last few years. Pt reports everything taste salty. Pt decreased salt intake to deal with this but is now concerned for low sodium  Patient reports pmh of bilateral trigeminal neuralgia, neopathy, balanic issues, heat intolerance, raynaud's. Pt st she has been in town since December and was just here to get affairs in order lives in [place] but has not been able to get home. Pt reports a lot of stress and all the pmh issues have been acting up.   Pt also reports chronic cough.

## 2022-01-13 NOTE — ED Provider Notes (Signed)
[facility]    CSN: [id number] Arrival date & time: 01/13/22  1424      History   Chief Complaint Chief Complaint  Patient presents with   Fatigue    HPI Catherine Gregory is a 76 y.o. female.   HPI Here for wt loss and weakness. Has had altered taste for at least 2 yrs. Uncertain if ever had covid. States has struggled maintaining wt for 20 yrs. Has abdominal upset chronically. States has had colonosopy in the fall, and mmg utd.  No syncope. She has not had hematuria or blood in stool. No f/c/vomiting. No recent URI symptoms.  Has other chronic issues like fibromyalgia and trigeminal neuralgia. Not on any rx meds except estradiol patch  Has had a chronic cough for decades, evaluated a long time ago.  Past Medical History:  Diagnosis Date   Abdominal pain, chronic, generalized    Abdominal pain, RUQ    Abnormal weight loss    Cachexia (HCC)    Carpal tunnel syndrome    Constipation    COPD (chronic obstructive pulmonary disease) (HCC)    Depression with anxiety    Disturbance of skin sensation 08/13/2013   Dysphagia, idiopathic    Elevated antinuclear antibody (ANA) level    GERD (gastroesophageal reflux disease)    GERD (gastroesophageal reflux disease)    Hiatal hernia    Hyperlipidemia    Idiopathic peripheral neuropathy    Lymphadenopathy, inguinal    Malnutrition (HCC)    Microscopic hematuria    Migraine    Myalgia and myositis, unspecified 08/13/2013   Nephrocalcinosis    Renal calculus    Right thyroid nodule    Spleen enlarged    Vitamin B12 deficiency    Vitamin D deficiency     Patient Active Problem List   Diagnosis Date Noted   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   Malnourished (HCC) 08/16/2017   Fibromyalgia affecting shoulder region 09/29/2016   Pain of both shoulder joints 09/13/2016   Fatigue 07/03/2015   Skin lesion 07/03/2015   Hypothyroidism 09/30/2014   Hoarseness, chronic 09/15/2014   B12 deficiency 07/14/2014    Rectocele 01/16/2014   Dysphagia, idiopathic 09/25/2013   GERD (gastroesophageal reflux disease) 09/25/2013   hyperinflation on cxr of no clinical significance 09/25/2013   Vitamin D deficiency 09/25/2013   Idiopathic peripheral neuropathy 09/25/2013   Unspecified constipation 09/25/2013   Thyroid nodule 09/25/2013   Nephrocalcinosis 09/25/2013   Other and unspecified hyperlipidemia 09/25/2013   Myalgia and myositis, unspecified 08/13/2013    Past Surgical History:  Procedure Laterality Date   ABDOMINAL HYSTERECTOMY  1998   Bx of skin at outer ankle Bilateral April or May of 2016   CHOLECYSTECTOMY  1978   DILATION AND CURETTAGE OF UTERUS  1998   NASAL SEPTUM SURGERY  1984   TUBAL LIGATION Bilateral 1988    OB History     Gravida  2   Para  2   Term      Preterm      AB      Living         SAB      IAB      Ectopic      Multiple      Live Births               Home Medications    Prior to Admission medications   Medication Sig Start Date End Date Taking? Authorizing Provider  Cholecalciferol (VITAMIN D3) LIQD Take 6,000  Units by mouth daily. Reported on 11/05/2015    [provider]  estradiol (CLIMARA - DOSED IN MG/24 HR) 0.025 mg/24hr patch PLACE 1 PATCH ONTH THE SKIN ONCE A WEEK 03/08/18   Allie Bossier, MD  Vitamin D, Ergocalciferol, (DRISDOL) 50000 units CAPS capsule TAKE 1 CAPSULE EVERY 7 DAYS 10/09/17   Judi Saa, DO    Family History Family History  Problem Relation Age of Onset   Heart Problems Father    Hyperlipidemia Father    Heart disease Father    Hypertension Father    Multiple sclerosis Sister    Heart Problems Brother    Hypertension Brother    Heart Problems Brother    Hypertension Brother    Hypertension Brother    Hypertension Brother    Hypertension Brother    Schizophrenia Mother    Cancer Maternal Aunt        Colon    Social History Social History   Tobacco Use   Smoking status: Never   Smokeless  tobacco: Never  Substance Use Topics   Alcohol use: No    Alcohol/week: 0.0 standard drinks    Comment: FORMER WINE OCCASIONALLY/ QUIT 1997   Drug use: No     Allergies   Levaquin [levofloxacin in d5w], Prednisone, Augmentin [amoxicillin-pot clavulanate], Contrast media [iodinated contrast media], Other, Albuterol, Doxycycline, Ducodyl [bisacodyl], Erythromycin, and Fentanyl and related   Review of Systems Review of Systems   Physical Exam Triage Vital Signs ED Triage Vitals  Enc Vitals Group     BP 01/13/22 1445 (!) 142/75     Pulse Rate 01/13/22 1445 (!) 120     Resp 01/13/22 1445 18     Temp 01/13/22 1445 97.6 F (36.4 C)     Temp src --      SpO2 01/13/22 1445 93 %     Weight 01/13/22 1508 86 lb 3.2 oz (39.1 kg)     Height --      Head Circumference --      Peak Flow --      Pain Score 01/13/22 1444 0     Pain Loc --      Pain Edu? --      Excl. in GC? --    No data found.  Updated Vital Signs BP (!) 142/75   Pulse (!) 120   Temp 97.6 F (36.4 C)   Resp 18   Wt 39.1 kg   SpO2 93%   BMI 13.91 kg/m   Visual Acuity Right Eye Distance:   Left Eye Distance:   Bilateral Distance:    Right Eye Near:   Left Eye Near:    Bilateral Near:     Physical Exam Vitals reviewed.  Constitutional:      General: She is not in acute distress.    Appearance: She is not ill-appearing, toxic-appearing or diaphoretic.  HENT:     Nose: Nose normal.     Mouth/Throat:     Mouth: Mucous membranes are moist.     Pharynx: No oropharyngeal exudate or posterior oropharyngeal erythema.  Eyes:     Extraocular Movements: Extraocular movements intact.     Conjunctiva/sclera: Conjunctivae normal.     Pupils: Pupils are equal, round, and reactive to light.  Cardiovascular:     Rate and Rhythm: Normal rate and regular rhythm.  Pulmonary:     Effort: Pulmonary effort is normal.     Breath sounds: Normal breath sounds.  Abdominal:  Palpations: Abdomen is soft.      Tenderness: There is no abdominal tenderness.  Musculoskeletal:     Cervical back: Neck supple.  Skin:    Coloration: Skin is not jaundiced or pale.     Comments: There is an area of flat erythema, nontender, and not indurated, on sacrum to the right of coccyx. About 2 cm diameter. Has tiny eschar on it.  Neurological:     General: No focal deficit present.     Mental Status: She is alert and oriented to person, place, and time.  Psychiatric:        Behavior: Behavior normal.     UC Treatments / Results  Labs (all labs ordered are listed, but only abnormal results are displayed) Labs Reviewed  COMPREHENSIVE METABOLIC PANEL  CBC WITH DIFFERENTIAL/PLATELET  TSH    EKG   Radiology No results found.  Procedures Procedures (including critical care time)  Medications Ordered in UC Medications - No data to display  Initial Impression / Assessment and Plan / UC Course  I have reviewed the triage vital signs and the nursing notes.  Pertinent labs & imaging results that were available during my care of the patient were reviewed by me and considered in my medical decision making (see chart for details).     Discussed care for the decubitus, and to change positions frequently. States only can lie on back.  Wt down about 8 kg compared to 4 yrs ago in care everywhere  Lab today to look for urgent issues. Request made to find her a pcp.  Final Clinical Impressions(s) / UC Diagnoses   Final diagnoses:  Moderate protein-calorie malnutrition (HCC)  Weakness     Discharge Instructions      Please see a general doctor soon, in the next 2-3 weeks.   Staff will call you if anything is extremely abnormal on your lab.  Try drinking ensure or boost daily.  It is ok to take a stool softener daily.     ED Prescriptions   None    PDMP not reviewed this encounter.   Zenia Resides, MD 01/13/22 1515

## 2022-01-14 LAB — CBC WITH DIFFERENTIAL/PLATELET
Basophils Absolute: 0.1 10*3/uL (ref 0.0–0.2)
Basos: 1 %
EOS (ABSOLUTE): 0.1 10*3/uL (ref 0.0–0.4)
Eos: 1 %
Hematocrit: 42.2 % (ref 34.0–46.6)
Hemoglobin: 14.4 g/dL (ref 11.1–15.9)
Immature Grans (Abs): 0.1 10*3/uL (ref 0.0–0.1)
Immature Granulocytes: 1 %
Lymphocytes Absolute: 1.6 10*3/uL (ref 0.7–3.1)
Lymphs: 14 %
MCH: 31.1 pg (ref 26.6–33.0)
MCHC: 34.1 g/dL (ref 31.5–35.7)
MCV: 91 fL (ref 79–97)
Monocytes Absolute: 0.5 10*3/uL (ref 0.1–0.9)
Monocytes: 5 %
Neutrophils Absolute: 8.7 10*3/uL — ABNORMAL HIGH (ref 1.4–7.0)
Neutrophils: 78 %
Platelets: 346 10*3/uL (ref 150–450)
RBC: 4.63 x10E6/uL (ref 3.77–5.28)
RDW: 12.5 % (ref 11.7–15.4)
WBC: 11 10*3/uL — ABNORMAL HIGH (ref 3.4–10.8)

## 2022-01-14 LAB — COMPREHENSIVE METABOLIC PANEL
ALT: 15 IU/L (ref 0–32)
AST: 23 IU/L (ref 0–40)
Albumin/Globulin Ratio: 1.1 — ABNORMAL LOW (ref 1.2–2.2)
Albumin: 4.1 g/dL (ref 3.7–4.7)
Alkaline Phosphatase: 118 IU/L (ref 44–121)
BUN/Creatinine Ratio: 33 — ABNORMAL HIGH (ref 12–28)
BUN: 19 mg/dL (ref 8–27)
Bilirubin Total: 0.3 mg/dL (ref 0.0–1.2)
CO2: 24 mmol/L (ref 20–29)
Calcium: 10 mg/dL (ref 8.7–10.3)
Chloride: 95 mmol/L — ABNORMAL LOW (ref 96–106)
Creatinine, Ser: 0.58 mg/dL (ref 0.57–1.00)
Globulin, Total: 3.8 g/dL (ref 1.5–4.5)
Glucose: 145 mg/dL — ABNORMAL HIGH (ref 70–99)
Potassium: 4.4 mmol/L (ref 3.5–5.2)
Sodium: 135 mmol/L (ref 134–144)
Total Protein: 7.9 g/dL (ref 6.0–8.5)
eGFR: 94 mL/min/{1.73_m2} (ref >59)

## 2022-01-14 LAB — TSH: TSH: 2.78 u[IU]/mL (ref 0.450–4.500)

## 2022-01-27 ENCOUNTER — Encounter: Payer: Self-pay | Admitting: Nurse Practitioner

## 2022-01-27 ENCOUNTER — Ambulatory Visit (INDEPENDENT_AMBULATORY_CARE_PROVIDER_SITE_OTHER): Payer: Medicare HMO | Admitting: Nurse Practitioner

## 2022-01-27 VITALS — BP 135/71 | HR 110 | Temp 97.2°F | Ht 66.14 in | Wt 86.0 lb

## 2022-01-27 DIAGNOSIS — Z5181 Encounter for therapeutic drug level monitoring: Secondary | ICD-10-CM

## 2022-01-27 DIAGNOSIS — Z79890 Hormone replacement therapy (postmenopausal): Secondary | ICD-10-CM

## 2022-01-27 DIAGNOSIS — R531 Weakness: Secondary | ICD-10-CM | POA: Diagnosis not present

## 2022-01-27 DIAGNOSIS — R6889 Other general symptoms and signs: Secondary | ICD-10-CM | POA: Diagnosis not present

## 2022-01-27 DIAGNOSIS — R636 Underweight: Secondary | ICD-10-CM

## 2022-01-27 DIAGNOSIS — R7301 Impaired fasting glucose: Secondary | ICD-10-CM

## 2022-01-27 DIAGNOSIS — G500 Trigeminal neuralgia: Secondary | ICD-10-CM | POA: Diagnosis not present

## 2022-01-27 DIAGNOSIS — Z7689 Persons encountering health services in other specified circumstances: Secondary | ICD-10-CM

## 2022-01-27 DIAGNOSIS — R5383 Other fatigue: Secondary | ICD-10-CM | POA: Diagnosis not present

## 2022-01-27 MED ORDER — ESTRADIOL 0.025 MG/24HR TD PTWK: MEDICATED_PATCH | TRANSDERMAL | 1 refills | Status: DC

## 2022-01-27 NOTE — Progress Notes (Signed)
New Patient Office Visit  Subjective    [name], female    DOB: [birth year]  Age: 76 y.o. MRN: [id number]  CC:  Chief Complaint  Patient presents with   New Patient (Initial Visit)    HPI Catherine Gregory presents to establish care The patient came to this area 6 months ago from [place]. She planned to come for just a few months, to get things in order and then return to [place]. This has ended up being much longer than expected . -increased stress levels involving family.  -temperature intolerance. Feels very hot - did go to urgent care a few weeks ago. TSH was normal. Her WBC and glucose levels were elevated.  -feels weak. Feels as though she is so fatigued that she cannot keep moving. Has to rest.  --she does have a primary care provider and several specialists in [place]. This is where she lives permanently. Plans to return t [place] at the end of this month.  -needs to have new prescription for her estradiol patch filled and sent to local pharmacy.    Outpatient Encounter Medications as of 01/27/2022  Medication Sig   [DISCONTINUED] estradiol (CLIMARA - DOSED IN MG/24 HR) 0.025 mg/24hr patch PLACE 1 PATCH ONTH THE SKIN ONCE A WEEK   Cholecalciferol (VITAMIN D3) LIQD Take 6,000 Units by mouth daily. Reported on 11/05/2015 (Patient not taking: Reported on 01/27/2022)   estradiol (CLIMARA - DOSED IN MG/24 HR) 0.025 mg/24hr patch PLACE 1 PATCH ONTH THE SKIN ONCE A WEEK   Vitamin D, Ergocalciferol, (DRISDOL) 50000 units CAPS capsule TAKE 1 CAPSULE EVERY 7 DAYS (Patient not taking: Reported on 01/27/2022)   No facility-administered encounter medications on file as of 01/27/2022.    Past Medical History:  Diagnosis Date   Abdominal pain, chronic, generalized    Abdominal pain, RUQ    Abnormal weight loss    Cachexia (HCC)    Carpal tunnel syndrome    Constipation    COPD (chronic obstructive pulmonary disease) (HCC)    Depression with anxiety    Disturbance of skin  sensation 08/13/2013   Dysphagia, idiopathic    Elevated antinuclear antibody (ANA) level    GERD (gastroesophageal reflux disease)    GERD (gastroesophageal reflux disease)    Hiatal hernia    Hyperlipidemia    Idiopathic peripheral neuropathy    Lymphadenopathy, inguinal    Malnutrition (HCC)    Microscopic hematuria    Migraine    Myalgia and myositis, unspecified 08/13/2013   Nephrocalcinosis    Renal calculus    Right thyroid nodule    Spleen enlarged    Vitamin B12 deficiency    Vitamin D deficiency     Past Surgical History:  Procedure Laterality Date   ABDOMINAL HYSTERECTOMY  1998   Bx of skin at outer ankle Bilateral April or May of 2016   CHOLECYSTECTOMY  1978   DILATION AND CURETTAGE OF UTERUS  1998   NASAL SEPTUM SURGERY  1984   TUBAL LIGATION Bilateral 1988    Family History  Problem Relation Age of Onset   Heart Problems Father    Hyperlipidemia Father    Heart disease Father    Hypertension Father    Multiple sclerosis Sister    Heart Problems Brother    Hypertension Brother    Heart Problems Brother    Hypertension Brother    Hypertension Brother    Hypertension Brother    Hypertension Brother    Schizophrenia Mother  Cancer Maternal Aunt        Colon    Social History   Socioeconomic History   Marital status: Single    Spouse name: Not on file   Number of children: 2   Years of education: COLLEGE-2   Highest education level: Not on file  Occupational History   Occupation: RETIRED  Tobacco Use   Smoking status: Never   Smokeless tobacco: Never  Substance and Sexual Activity   Alcohol use: No    Alcohol/week: 0.0 standard drinks of alcohol    Comment: FORMER WINE OCCASIONALLY/ QUIT 1997   Drug use: No   Sexual activity: Not Currently  Other Topics Concern   Not on file  Social History Narrative   She currently lives with brother and sister-in-law.   She has two grown children.  She moved from [place] in 2014 where she lived  with her daughter.   Highest level of education:  2 years of college   She went on disability due to sensitivity to "chemical problems"       Social Determinants of Health   Financial Resource Strain: Not on file  Food Insecurity: Not on file  Transportation Needs: Not on file  Physical Activity: Not on file  Stress: Not on file  Social Connections: Not on file  Intimate Partner Violence: Not on file    Review of Systems  Constitutional:  Positive for malaise/fatigue. Negative for chills and fever.  HENT:  Negative for congestion, sinus pain and sore throat.   Eyes: Negative.   Respiratory:  Negative for cough, shortness of breath and wheezing.   Cardiovascular:  Positive for palpitations. Negative for chest pain and leg swelling.  Gastrointestinal:  Negative for constipation, diarrhea, nausea and vomiting.  Genitourinary: Negative.   Musculoskeletal:  Negative for myalgias.  Skin: Negative.   Neurological:  Positive for headaches. Negative for dizziness.  Endo/Heme/Allergies:  Does not bruise/bleed easily.       Heat intolerance   Psychiatric/Behavioral:  Negative for depression. The patient is nervous/anxious.        Increased family stress        Objective    Today's Vitals   01/27/22 1121  BP: 135/71  Pulse: (!) 110  Temp: (!) 97.2 F (36.2 C)  SpO2: 98%  Weight: 86 lb (39 kg)  Height: 5' 6.14" (1.68 m)   Body mass index is 13.82 kg/m.   Physical Exam Vitals and nursing note reviewed.  Constitutional:      Appearance: Normal appearance. She is well-developed.  HENT:     Head: Normocephalic and atraumatic.     Nose: Nose normal.     Mouth/Throat:     Mouth: Mucous membranes are moist.     Pharynx: Oropharynx is clear.  Eyes:     Extraocular Movements: Extraocular movements intact.     Conjunctiva/sclera: Conjunctivae normal.     Pupils: Pupils are equal, round, and reactive to light.  Cardiovascular:     Rate and Rhythm: Normal rate and regular  rhythm.     Pulses: Normal pulses.     Heart sounds: Normal heart sounds.  Pulmonary:     Effort: Pulmonary effort is normal.     Breath sounds: Normal breath sounds.  Abdominal:     Palpations: Abdomen is soft.  Musculoskeletal:        General: Normal range of motion.     Cervical back: Normal range of motion and neck supple.  Lymphadenopathy:     Cervical:  No cervical adenopathy.  Skin:    General: Skin is warm and dry.     Capillary Refill: Capillary refill takes less than 2 seconds.  Neurological:     General: No focal deficit present.     Mental Status: She is alert and oriented to person, place, and time.     Comments: Mild, generalized weakness.   Psychiatric:        Mood and Affect: Mood normal.        Behavior: Behavior normal.        Thought Content: Thought content normal.        Judgment: Judgment normal.      Assessment & Plan:  1. Weakness Check labs, including full thyroid panel and anemia panels and treat abnormalities as indicated.  - Comp Met (CMET); Future - Thyroglobulin Level; Future - Thyroid peroxidase antibody; Future - CBC With Differential; Future - TSH + free T4; Future - Iron, TIBC and Ferritin Panel; Future - Vitamin B12; Future - Vitamin B12 - Iron, TIBC and Ferritin Panel - TSH + free T4 - CBC With Differential - Thyroid peroxidase antibody - Thyroglobulin Level - Comp Met (CMET)  2. Heat intolerance Check labs, including full thyroid panel and anemia panels and treat abnormalities as indicated.  - Comp Met (CMET); Future - Thyroglobulin Level; Future - Thyroid peroxidase antibody; Future - CBC With Differential; Future - TSH + free T4; Future - Vitamin B12; Future - Vitamin B12 - TSH + free T4 - CBC With Differential - Thyroid peroxidase antibody - Thyroglobulin Level - Comp Met (CMET)  3. Trigeminal neuralgia Consider vitamin deficiency causing increased symptoms related to trigeminal neuralgia.  - Thyroglobulin Level;  Future - Thyroid peroxidase antibody; Future - CBC With Differential; Future - TSH + free T4; Future - Vitamin B12; Future - Vitamin B12 - TSH + free T4 - CBC With Differential - Thyroid peroxidase antibody - Thyroglobulin Level  4. Other fatigue Check labs, including full thyroid panel and anemia panels and treat abnormalities as indicated.  - Comp Met (CMET); Future - Thyroglobulin Level; Future - Thyroid peroxidase antibody; Future - CBC With Differential; Future - TSH + free T4; Future - Iron, TIBC and Ferritin Panel; Future - Vitamin B12; Future - Vitamin B12 - Iron, TIBC and Ferritin Panel - TSH + free T4 - CBC With Differential - Thyroid peroxidase antibody - Thyroglobulin Level - Comp Met (CMET)  5. Underweight Check labs, including full thyroid panel and anemia panels and treat abnormalities as indicated.  - Comp Met (CMET); Future - Thyroglobulin Level; Future - Thyroid peroxidase antibody; Future - CBC With Differential; Future - TSH + free T4; Future - Vitamin B12; Future - Vitamin B12 - TSH + free T4 - CBC With Differential - Thyroid peroxidase antibody - Thyroglobulin Level - Comp Met (CMET)  6. Impaired fasting glucose Check HgbA1c for evidence of diabetes. - Hemoglobin A1c; Future - Hemoglobin A1c  7. Encounter for monitoring postmenopausal estrogen replacement therapy Patient may continue to use estradiol patch at 0.016m/24 hours. New prescription sent to her pharmacy today.  - estradiol (CLIMARA - DOSED IN MG/24 HR) 0.025 mg/24hr patch; PLACE 1 PATCH ONTH THE SKIN ONCE A WEEK  Dispense: 12 patch; Refill: 1  8. Encounter to establish care Appointment today to establish new primary care provider      Problem List Items Addressed This Visit       Endocrine   Impaired fasting glucose   Relevant Orders   Hemoglobin A1c (  Completed)     Nervous and Auditory   Trigeminal neuralgia   Relevant Orders   Thyroglobulin Level (Completed)    Thyroid peroxidase antibody (Completed)   CBC With Differential (Completed)   TSH + free T4 (Completed)   Vitamin B12 (Completed)     Other   Weakness - Primary   Relevant Orders   Comp Met (CMET) (Completed)   Thyroglobulin Level (Completed)   Thyroid peroxidase antibody (Completed)   CBC With Differential (Completed)   TSH + free T4 (Completed)   Iron, TIBC and Ferritin Panel (Completed)   Vitamin B12 (Completed)   Heat intolerance   Relevant Orders   Comp Met (CMET) (Completed)   Thyroglobulin Level (Completed)   Thyroid peroxidase antibody (Completed)   CBC With Differential (Completed)   TSH + free T4 (Completed)   Vitamin B12 (Completed)   Underweight   Relevant Orders   Comp Met (CMET) (Completed)   Thyroglobulin Level (Completed)   Thyroid peroxidase antibody (Completed)   CBC With Differential (Completed)   TSH + free T4 (Completed)   Vitamin B12 (Completed)   Other Visit Diagnoses     Encounter for monitoring postmenopausal estrogen replacement therapy       Relevant Medications   estradiol (CLIMARA - DOSED IN MG/24 HR) 0.025 mg/24hr patch   Encounter to establish care           Return in about 2 weeks (around 02/10/2022) for review labs.   Ronnell Freshwater, NP

## 2022-01-28 LAB — CBC WITH DIFFERENTIAL
Basophils Absolute: 0.1 10*3/uL (ref 0.0–0.2)
Basos: 1 %
EOS (ABSOLUTE): 0.1 10*3/uL (ref 0.0–0.4)
Eos: 1 %
Hematocrit: 42.1 % (ref 34.0–46.6)
Hemoglobin: 14.1 g/dL (ref 11.1–15.9)
Immature Grans (Abs): 0 10*3/uL (ref 0.0–0.1)
Immature Granulocytes: 0 %
Lymphocytes Absolute: 1.9 10*3/uL (ref 0.7–3.1)
Lymphs: 22 %
MCH: 30.9 pg (ref 26.6–33.0)
MCHC: 33.5 g/dL (ref 31.5–35.7)
MCV: 92 fL (ref 79–97)
Monocytes Absolute: 0.5 10*3/uL (ref 0.1–0.9)
Monocytes: 6 %
Neutrophils Absolute: 6 10*3/uL (ref 1.4–7.0)
Neutrophils: 70 %
RBC: 4.57 x10E6/uL (ref 3.77–5.28)
RDW: 12.7 % (ref 11.7–15.4)
WBC: 8.5 10*3/uL (ref 3.4–10.8)

## 2022-01-28 LAB — IRON,TIBC AND FERRITIN PANEL
Ferritin: 177 ng/mL — ABNORMAL HIGH (ref 15–150)
Iron Saturation: 21 % (ref 15–55)
Iron: 68 ug/dL (ref 27–139)
Total Iron Binding Capacity: 321 ug/dL (ref 250–450)
UIBC: 253 ug/dL (ref 118–369)

## 2022-01-28 LAB — HEMOGLOBIN A1C
Est. average glucose Bld gHb Est-mCnc: 117 mg/dL
Hgb A1c MFr Bld: 5.7 % — ABNORMAL HIGH (ref 4.8–5.6)

## 2022-01-28 LAB — VITAMIN B12: Vitamin B-12: 568 pg/mL (ref 232–1245)

## 2022-01-28 LAB — TSH+FREE T4
Free T4: 1.27 ng/dL (ref 0.82–1.77)
TSH: 3.69 u[IU]/mL (ref 0.450–4.500)

## 2022-01-28 LAB — SPECIMEN STATUS REPORT

## 2022-01-28 NOTE — Progress Notes (Signed)
Thus far, all labs good. Waiting on all results.

## 2022-02-06 DIAGNOSIS — R7301 Impaired fasting glucose: Secondary | ICD-10-CM | POA: Insufficient documentation

## 2022-02-06 DIAGNOSIS — R636 Underweight: Secondary | ICD-10-CM | POA: Insufficient documentation

## 2022-02-06 DIAGNOSIS — R6889 Other general symptoms and signs: Secondary | ICD-10-CM | POA: Insufficient documentation

## 2022-02-06 DIAGNOSIS — G500 Trigeminal neuralgia: Secondary | ICD-10-CM | POA: Insufficient documentation

## 2022-02-07 LAB — COMPREHENSIVE METABOLIC PANEL
ALT: 24 IU/L (ref 0–32)
AST: 32 IU/L (ref 0–40)
Albumin/Globulin Ratio: 1.1 — ABNORMAL LOW (ref 1.2–2.2)
Albumin: 4.2 g/dL (ref 3.7–4.7)
Alkaline Phosphatase: 109 IU/L (ref 44–121)
BUN/Creatinine Ratio: 31 — ABNORMAL HIGH (ref 12–28)
BUN: 17 mg/dL (ref 8–27)
Bilirubin Total: 0.3 mg/dL (ref 0.0–1.2)
CO2: 26 mmol/L (ref 20–29)
Calcium: 10 mg/dL (ref 8.7–10.3)
Chloride: 96 mmol/L (ref 96–106)
Creatinine, Ser: 0.55 mg/dL — ABNORMAL LOW (ref 0.57–1.00)
Globulin, Total: 3.7 g/dL (ref 1.5–4.5)
Glucose: 103 mg/dL — ABNORMAL HIGH (ref 70–99)
Potassium: 5.1 mmol/L (ref 3.5–5.2)
Sodium: 137 mmol/L (ref 134–144)
Total Protein: 7.9 g/dL (ref 6.0–8.5)
eGFR: 95 mL/min/{1.73_m2} (ref >59)

## 2022-02-07 LAB — THYROID PEROXIDASE ANTIBODY: Thyroperoxidase Ab SerPl-aCnc: 16 IU/mL (ref 0–34)

## 2022-02-07 LAB — THYROGLOBULIN LEVEL: Thyroglobulin (TG-RIA): 17 ng/mL

## 2022-02-13 NOTE — Progress Notes (Unsigned)
Established patient visit   Patient: Catherine Gregory   DOB: 08-May-1946   76 y.o. Female  MRN: [id number] Visit Date: 02/14/2022   No chief complaint on file.  Subjective    HPI  Follow up visit  -had been feeling weak.  --cmp indicated mild dehydration. --thyroid panel WNL --HgbA1c 5.7  --blood count normal.  -patient planning to [place] back [place] different [place] as soon as she is able to.    Medications: Outpatient Medications Prior to Visit  Medication Sig   Cholecalciferol (VITAMIN D3) LIQD Take 6,000 Units by mouth daily. Reported on 11/05/2015 (Patient not taking: Reported on 01/27/2022)   estradiol (CLIMARA - DOSED IN MG/24 HR) 0.025 mg/24hr patch PLACE 1 PATCH ONTH THE SKIN ONCE A WEEK   Vitamin D, Ergocalciferol, (DRISDOL) 50000 units CAPS capsule TAKE 1 CAPSULE EVERY 7 DAYS (Patient not taking: Reported on 01/27/2022)   No facility-administered medications prior to visit.    Review of Systems  Last CBC Lab Results  Component Value Date   WBC 8.5 01/27/2022   HGB 14.1 01/27/2022   HCT 42.1 01/27/2022   MCV 92 01/27/2022   MCH 30.9 01/27/2022   RDW 12.7 01/27/2022   PLT 346 53/61/4431   Last metabolic panel Lab Results  Component Value Date   GLUCOSE 103 (H) 01/27/2022   NA 137 01/27/2022   K 5.1 01/27/2022   CL 96 01/27/2022   CO2 26 01/27/2022   BUN 17 01/27/2022   CREATININE 0.55 (L) 01/27/2022   EGFR 95 01/27/2022   CALCIUM 10.0 01/27/2022   PROT 7.9 01/27/2022   ALBUMIN 4.2 01/27/2022   LABGLOB 3.7 01/27/2022   AGRATIO 1.1 (L) 01/27/2022   BILITOT 0.3 01/27/2022   ALKPHOS 109 01/27/2022   AST 32 01/27/2022   ALT 24 01/27/2022   Last lipids Lab Results  Component Value Date   CHOL 172 08/16/2017   HDL 84.90 08/16/2017   LDLCALC 65 08/16/2017   LDLDIRECT 90.8 10/10/2013   TRIG 114.0 08/16/2017   CHOLHDL 2 08/16/2017   Last hemoglobin A1c Lab Results  Component Value Date   HGBA1C 5.7 (H) 01/27/2022   Last thyroid functions Lab Results   Component Value Date   TSH 3.690 01/27/2022   Last vitamin D Lab Results  Component Value Date   VD25OH 29.48 (L) 08/16/2017       Objective    There were no vitals taken for this visit. BP Readings from Last 3 Encounters:  01/27/22 135/71  01/13/22 (!) 142/75  08/15/17 112/78    Wt Readings from Last 3 Encounters:  01/27/22 86 lb (39 kg)  01/13/22 86 lb 3.2 oz (39.1 kg)  08/15/17 104 lb (47.2 kg)    Physical Exam  ***  No results found for any visits on 02/14/22.  Assessment & Plan     Problem List Items Addressed This Visit   None    No follow-ups on file.         Ronnell Freshwater, NP  [facility] [phone] (phone) [phone] (fax)  [place]

## 2022-02-14 ENCOUNTER — Ambulatory Visit (INDEPENDENT_AMBULATORY_CARE_PROVIDER_SITE_OTHER): Payer: Medicare HMO | Admitting: Nurse Practitioner

## 2022-02-14 ENCOUNTER — Encounter: Payer: Self-pay | Admitting: Nurse Practitioner

## 2022-02-14 VITALS — BP 117/74 | HR 94 | Temp 97.4°F | Ht 66.14 in | Wt 86.0 lb

## 2022-02-14 DIAGNOSIS — R531 Weakness: Secondary | ICD-10-CM

## 2022-02-14 DIAGNOSIS — E039 Hypothyroidism, unspecified: Secondary | ICD-10-CM | POA: Diagnosis not present

## 2022-02-14 DIAGNOSIS — Z5181 Encounter for therapeutic drug level monitoring: Secondary | ICD-10-CM | POA: Diagnosis not present

## 2022-02-14 DIAGNOSIS — R636 Underweight: Secondary | ICD-10-CM

## 2022-02-14 DIAGNOSIS — Z79890 Hormone replacement therapy (postmenopausal): Secondary | ICD-10-CM

## 2022-02-14 MED ORDER — ESTRADIOL 0.025 MG/24HR TD PTWK: MEDICATED_PATCH | TRANSDERMAL | 1 refills | Status: DC

## 2022-09-29 ENCOUNTER — Other Ambulatory Visit: Payer: Self-pay | Admitting: Nurse Practitioner

## 2022-09-29 DIAGNOSIS — Z5181 Encounter for therapeutic drug level monitoring: Secondary | ICD-10-CM

## 2022-12-27 ENCOUNTER — Telehealth: Payer: Self-pay

## 2022-12-27 NOTE — Telephone Encounter (Signed)
Called patient to schedule Medicare Annual Wellness Visit (AWV). Unable to reach patient.  Last date of AWV: eligible 08/29/09 for AWV-I  Please schedule an appointment at any time On Annual Wellness Visit Schedule.     Thank you ,  Agnes Lawrence, CMA (AAMA)  [facility]- AWV Program [phone]
